# Patient Record
Sex: Female | Born: 2000 | Hispanic: Yes | Marital: Single | Smoking: Never smoker
Health system: Southern US, Community
[De-identification: ages and names within clinical notes are randomized; demographics above are authoritative.]

## PROBLEM LIST (undated history)

## (undated) DIAGNOSIS — Z789 Other specified health status: Secondary | ICD-10-CM

## (undated) HISTORY — PX: OTHER SURGICAL HISTORY: SHX169

## (undated) HISTORY — DX: Other specified health status: Z78.9

## (undated) HISTORY — PX: NO PAST SURGERIES: SHX2092

---
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
## 2021-01-09 ENCOUNTER — Other Ambulatory Visit: Payer: Self-pay

## 2021-01-09 ENCOUNTER — Ambulatory Visit (LOCAL_COMMUNITY_HEALTH_CENTER): Payer: Self-pay

## 2021-01-09 DIAGNOSIS — Z23 Encounter for immunization: Secondary | ICD-10-CM

## 2021-01-09 NOTE — Progress Notes (Signed)
Tolerated flu vaccine well today. Updated [facility] copy given. [employer], interpreter. Jerel Shepherd, RN

## 2021-01-16 ENCOUNTER — Ambulatory Visit (LOCAL_COMMUNITY_HEALTH_CENTER): Payer: Self-pay

## 2021-01-16 ENCOUNTER — Other Ambulatory Visit: Payer: Self-pay

## 2021-01-16 VITALS — BP 119/77 | Wt 172.0 lb

## 2021-01-16 DIAGNOSIS — Z3201 Encounter for pregnancy test, result positive: Secondary | ICD-10-CM

## 2021-01-16 LAB — PREGNANCY, URINE: Preg Test, Ur: POSITIVE — AB

## 2021-01-16 MED ORDER — PRENATAL 27-0.8 MG PO TABS: 1.0000 | ORAL_TABLET | Freq: Every day | ORAL | 0 refills | Status: AC

## 2021-01-16 NOTE — Progress Notes (Signed)
UPT positive today. Plans prenatal care at [facility]. To clerk for preadmit. Jerel Shepherd, RN

## 2021-01-30 ENCOUNTER — Ambulatory Visit: Payer: Medicaid Other | Admitting: Family Medicine

## 2021-01-30 ENCOUNTER — Encounter: Payer: Self-pay | Admitting: Family Medicine

## 2021-01-30 ENCOUNTER — Other Ambulatory Visit: Payer: Self-pay

## 2021-01-30 VITALS — BP 109/68 | HR 83 | Temp 98.9°F | Ht 63.0 in | Wt 171.2 lb

## 2021-01-30 DIAGNOSIS — E669 Obesity, unspecified: Secondary | ICD-10-CM | POA: Insufficient documentation

## 2021-01-30 DIAGNOSIS — O219 Vomiting of pregnancy, unspecified: Secondary | ICD-10-CM

## 2021-01-30 DIAGNOSIS — O99212 Obesity complicating pregnancy, second trimester: Secondary | ICD-10-CM | POA: Diagnosis not present

## 2021-01-30 DIAGNOSIS — Z3402 Encounter for supervision of normal first pregnancy, second trimester: Secondary | ICD-10-CM

## 2021-01-30 DIAGNOSIS — O99210 Obesity complicating pregnancy, unspecified trimester: Secondary | ICD-10-CM | POA: Insufficient documentation

## 2021-01-30 DIAGNOSIS — Z3400 Encounter for supervision of normal first pregnancy, unspecified trimester: Secondary | ICD-10-CM | POA: Insufficient documentation

## 2021-01-30 LAB — URINALYSIS
Bilirubin, UA: NEGATIVE
Glucose, UA: NEGATIVE
Ketones, UA: NEGATIVE
Nitrite, UA: NEGATIVE
Protein,UA: NEGATIVE
RBC, UA: NEGATIVE
Specific Gravity, UA: 1.02 (ref 1.005–1.030)
Urobilinogen, Ur: 0.2 mg/dL (ref 0.2–1.0)
pH, UA: 7 (ref 5.0–7.5)

## 2021-01-30 LAB — HEMOGLOBIN, FINGERSTICK: Hemoglobin: 12.9 g/dL (ref 11.1–15.9)

## 2021-01-30 LAB — WET PREP FOR TRICH, YEAST, CLUE
Trichomonas Exam: NEGATIVE
Yeast Exam: NEGATIVE

## 2021-01-30 MED ORDER — PROMETHAZINE HCL 25 MG PO TABS: 25.0000 mg | ORAL_TABLET | Freq: Four times a day (QID) | ORAL | 1 refills | Status: DC | PRN

## 2021-01-30 NOTE — Progress Notes (Signed)
[facility] HEALTH DEPT [facility] [address] [place] [address] [phone]  INITIAL PRENATAL VISIT NOTE  Subjective:  Selena Chavez is a 20 y.o. G1P0000 at [redacted]w[redacted]d being seen today to start prenatal care at the [facility] Department.  She is currently monitored for the following issues for this low-risk pregnancy and has Supervision of normal first pregnancy, antepartum; Obesity affecting pregnancy, antepartum; and Obesity without serious comorbidity on their problem list.  Patient reports vomiting.  Contractions: Not present. Vag. Bleeding: None.  Movement: Absent. Denies leaking of fluid.   Indications for ASA therapy (per uptodate) One of the following: Previous pregnancy with preeclampsia, especially early onset and with an adverse outcome No Multifetal gestation No Chronic hypertension No Type 1 or 2 diabetes mellitus No Chronic kidney disease No Autoimmune disease (antiphospholipid syndrome, systemic lupus erythematosus) No  Two or more of the following: Nulliparity Yes Obesity (body mass index >30 kg/m2) Yes Family history of preeclampsia in mother or sister No Age ?35 years No Sociodemographic characteristics (African American race, low socioeconomic level) Yes Personal risk factors (eg, previous pregnancy with low birth weight or small for gestational age infant, previous adverse pregnancy outcome [eg, stillbirth], interval >10 years between pregnancies) No   The following portions of the patient's history were reviewed and updated as appropriate: allergies, current medications, past family history, past medical history, past social history, past surgical history and problem list. Problem list updated.  Objective:   Vitals:   01/30/21 0831 01/30/21 0834  BP: 109/68   Pulse: 83   Temp: 98.9 F (37.2 C)   Weight: 171 lb 3.2 oz (77.7 kg)   Height:  5\' 3"  (1.6 m)    Fetal Status: Fetal Heart  Rate (bpm): 149 Fundal Height: 14 cm Movement: Absent  Presentation: Undeterminable   Physical Exam Vitals and nursing note reviewed.  Constitutional:      General: She is not in acute distress.    Appearance: Normal appearance. She is well-developed.  HENT:     Head: Normocephalic and atraumatic.     Right Ear: External ear normal.     Left Ear: External ear normal.     Nose: Nose normal. No congestion or rhinorrhea.     Mouth/Throat:     Lips: Pink.     Mouth: Mucous membranes are moist.     Dentition: Normal dentition. No dental caries.     Pharynx: Oropharynx is clear. Uvula midline.     Comments: Dentition: teeth present, no visible caries Eyes:     General: No scleral icterus.    Conjunctiva/sclera: Conjunctivae normal.  Neck:     Thyroid: No thyroid mass or thyromegaly.  Cardiovascular:     Rate and Rhythm: Normal rate.     Pulses: Normal pulses.     Comments: Extremities are warm and well perfused Pulmonary:     Effort: Pulmonary effort is normal.     Breath sounds: Normal breath sounds.  Chest:     Chest wall: No mass.  Breasts:     Tanner Score is 5. Breasts are symmetrical.     Right: Normal. No mass, nipple discharge, skin change or axillary adenopathy.     Left: Normal. No mass, nipple discharge, skin change or axillary adenopathy.    Abdominal:     General: Abdomen is flat.     Palpations: Abdomen is soft.     Tenderness: There is no abdominal tenderness.  Comments: Gravid   Genitourinary:    General: Normal vulva.     Exam position: Lithotomy position.     Pubic Area: No rash.      Labia:        Right: No rash.        Left: No rash.      Vagina: Normal. No vaginal discharge.     Cervix: No cervical motion tenderness or friability.     Uterus: Normal. Enlarged (Gravid 14 size). Not tender.      Adnexa: Right adnexa normal and left adnexa normal.     Rectum: Normal. No external hemorrhoid.  Musculoskeletal:     Right lower leg: No edema.      Left lower leg: No edema.  Lymphadenopathy:     Cervical: No cervical adenopathy.     Upper Body:     Right upper body: No axillary adenopathy.     Left upper body: No axillary adenopathy.  Skin:    General: Skin is warm.     Capillary Refill: Capillary refill takes less than 2 seconds.  Neurological:     Mental Status: She is alert.     Assessment and Plan:  Pregnancy: G1P0000 at [redacted]w[redacted]d  1. Supervision of normal first pregnancy, antepartum  Patient in clinic today for initial prenatal visit.  Patient is happy about pregnancy but was not planning pregnancy.   Patient lives in [place] with FOB, pt is not currently working.  FOB is happy and supportive about pregnancy.    Patient reports having vomiting and nausea most days for last 1.5 week.  Discussed medication management and information given to prevent nausea.   Patient denies any ER visit since being pregnant.  Patient is sure about LMP of 10/21/20.  Denies contraction or bleeding.  Denies any chronic medical issues, HSV , hx of any abuse, allergies. No history of PAP d/t age.  No hx of ETOH, tobacco or drug use.   No hx of previous pregnancies.   Covid and flu vaccine completed on 01/09/21 Discussed starting   Last dentist: 08/2020   Will have Quad screen and anatomy U/S at 18 weeks.   Patient delivery group is [facility]    Hgb A1c w/o eAG - HIV Antibody (routine testing w rflx) - Comprehensive metabolic panel - Prenatal profile without Varicella/Rubella (202542) - Protein / creatinine ratio, urine  (Spot) - Urine Culture - TSH - Chlamydia/GC NAA, Confirmation - HCV Ab w/Rflx to Verification - 706237 Drug Screen - WET PREP FOR TRICH, YEAST, CLUE - Urinalysis (Urine Dip) - Hemoglobin, venipuncture  2. Vomiting affecting pregnancy, antepartum Patient reports vomiting most days .  Last vomting was 1 day ago.  Will have patient RTC for 1 hr gtt. D/t vomiting.  Patient given directions to cut in 1/2 most days when needed, d/t  can make drowsy, but  take 1 full tablet on day of appointment for 1 hr gtt.  Also to have driver d/t risk of drowsy .  Patient verbalizes understanding.    - promethazine (PHENERGAN) 25 MG tablet; Take 1 tablet (25 mg total) by mouth every 6 (six) hours as needed for nausea or vomiting.  Dispense: 15 tablet; Refill: 0  3. Obesity affecting pregnancy, antepartum 4. Obesity without serious comorbidity, unspecified classification, unspecified obesity type   MNT referral completed today.  Patient to start ASA therapy.  Information sheet given.    Discussed overview of care and coordination with inpatient delivery practices including [facility], [facility], [facility] and  [facility].    Preterm labor symptoms and general obstetric precautions including but not limited to vaginal bleeding, contractions, leaking of fluid and fetal movement were reviewed in detail with the patient.  Please refer to After Visit Summary for other counseling recommendations.   Return in about 1 week (around 02/06/2021) for 1 hr gtt visit, 4 week routine prenatal follow up .  Future Appointments  Date Time Provider Department Center  02/06/2021  8:50 AM [facility] NURSE [facility] None  02/27/2021  8:20 AM [facility] PROVIDER AC-[facility] None   V. Olmedo used for [place] interpretation.     Wendi Snipes, FNP

## 2021-01-30 NOTE — Progress Notes (Signed)
Here today for 14.3 week MH IP appt. Taking PNV QD. Denies ED/hospital visits since +PT. Needs 1hgtt today but is complaining of frequent N/V. States last episode of vomiting was yesterday and reports vomiting up to three times daily. Complains "cannot keep even water down." States has only drank water this morning but has kept water down today. Tawny Hopping, RN

## 2021-01-30 NOTE — Progress Notes (Signed)
Wet [place], Hgb and UA results reviewed. Per standing orders no treatment indicated. Tawny Hopping, RN

## 2021-01-31 LAB — HCV AB W REFLEX TO QUANT PCR: HCV Ab: <0.1 s/co ratio (ref 0.0–0.9)

## 2021-01-31 LAB — COMPREHENSIVE METABOLIC PANEL
ALT: 17 IU/L (ref 0–32)
AST: 25 IU/L (ref 0–40)
Albumin/Globulin Ratio: 1.6 (ref 1.2–2.2)
Albumin: 4.3 g/dL (ref 3.9–5.0)
Alkaline Phosphatase: 59 IU/L (ref 42–106)
BUN/Creatinine Ratio: 9 (ref 9–23)
BUN: 4 mg/dL — ABNORMAL LOW (ref 6–20)
Bilirubin Total: <0.2 mg/dL (ref 0.0–1.2)
CO2: 19 mmol/L — ABNORMAL LOW (ref 20–29)
Calcium: 9.4 mg/dL (ref 8.7–10.2)
Chloride: 101 mmol/L (ref 96–106)
Creatinine, Ser: 0.45 mg/dL — ABNORMAL LOW (ref 0.57–1.00)
Globulin, Total: 2.7 g/dL (ref 1.5–4.5)
Glucose: 78 mg/dL (ref 65–99)
Potassium: 3.9 mmol/L (ref 3.5–5.2)
Sodium: 137 mmol/L (ref 134–144)
Total Protein: 7 g/dL (ref 6.0–8.5)
eGFR: 143 mL/min/{1.73_m2} (ref >59)

## 2021-01-31 LAB — CBC/D/PLT+RPR+RH+ABO+AB SCR
Antibody Screen: NEGATIVE
Basophils Absolute: 0 10*3/uL (ref 0.0–0.2)
Basos: 0 %
EOS (ABSOLUTE): 0.1 10*3/uL (ref 0.0–0.4)
Eos: 1 %
Hematocrit: 38.5 % (ref 34.0–46.6)
Hemoglobin: 12.8 g/dL (ref 11.1–15.9)
Hepatitis B Surface Ag: NEGATIVE
Immature Grans (Abs): 0 10*3/uL (ref 0.0–0.1)
Immature Granulocytes: 0 %
Lymphocytes Absolute: 1.4 10*3/uL (ref 0.7–3.1)
Lymphs: 19 %
MCH: 29.5 pg (ref 26.6–33.0)
MCHC: 33.2 g/dL (ref 31.5–35.7)
MCV: 89 fL (ref 79–97)
Monocytes Absolute: 0.4 10*3/uL (ref 0.1–0.9)
Monocytes: 5 %
Neutrophils Absolute: 5.5 10*3/uL (ref 1.4–7.0)
Neutrophils: 75 %
Platelets: 280 10*3/uL (ref 150–450)
RBC: 4.34 x10E6/uL (ref 3.77–5.28)
RDW: 13.7 % (ref 11.7–15.4)
RPR Ser Ql: NONREACTIVE
Rh Factor: POSITIVE
WBC: 7.5 10*3/uL (ref 3.4–10.8)

## 2021-01-31 LAB — 789231 7+OXYCODONE-BUND
Amphetamines, Urine: NEGATIVE ng/mL
BENZODIAZ UR QL: NEGATIVE ng/mL
Barbiturate screen, urine: NEGATIVE ng/mL
Cannabinoid Quant, Ur: NEGATIVE ng/mL
Cocaine (Metab.): NEGATIVE ng/mL
OPIATE SCREEN URINE: NEGATIVE ng/mL
Oxycodone/Oxymorphone, Urine: NEGATIVE ng/mL
PCP Quant, Ur: NEGATIVE ng/mL

## 2021-01-31 LAB — HCV INTERPRETATION

## 2021-01-31 LAB — PROTEIN / CREATININE RATIO, URINE
Creatinine, Urine: 109.7 mg/dL
Protein, Ur: 8.5 mg/dL
Protein/Creat Ratio: 77 mg/g creat (ref 0–200)

## 2021-01-31 LAB — HIV-1/HIV-2 QUALITATIVE RNA
HIV-1 RNA, Qualitative: NONREACTIVE
HIV-2 RNA, Qualitative: NONREACTIVE

## 2021-01-31 LAB — HGB A1C W/O EAG: Hgb A1c MFr Bld: 5.2 % (ref 4.8–5.6)

## 2021-01-31 LAB — TSH: TSH: 1.48 u[IU]/mL (ref 0.450–4.500)

## 2021-02-01 ENCOUNTER — Telehealth: Payer: Self-pay

## 2021-02-01 NOTE — Addendum Note (Signed)
Addended by: Wendi Snipes on: 02/01/2021 09:27 AM   Modules accepted: Orders

## 2021-02-01 NOTE — Telephone Encounter (Signed)
Call to [facility] scheduler for anatomy US appt electronically ordered by Elveria Rising FNP-BC. As 14 5/7, scheduler aware need appt in 4 - 5 weeks. Appt scheduled for 03/13/2021 with arrival time of 1230 with full bladder (1st available anatomy US appt). Call to client with [facility] [employer] ID # [phone] and per recorded message, not accepting calls at this time. Call to emergency contact (father) and per recorded message, voicemail box is full. Jossie Ng, RN

## 2021-02-02 LAB — URINE CULTURE

## 2021-02-02 LAB — CHLAMYDIA/GC NAA, CONFIRMATION
Chlamydia trachomatis, NAA: NEGATIVE
Neisseria gonorrhoeae, NAA: NEGATIVE

## 2021-02-02 NOTE — Telephone Encounter (Signed)
Phone call to pt with language line interpreter. [phone] Not accepting calls, unable to leave message.  Phone call  to [phone]. Mailbox full, unable to leave message.

## 2021-02-02 NOTE — Progress Notes (Signed)
Chart reviewed by Pharmacist  Suzanne Walker PharmD, Contract Pharmacist at [facility]  

## 2021-02-05 NOTE — Telephone Encounter (Signed)
Phone call to pt with language line interpreter [phone].  Call to [phone].  Voicemail not set up. Unable to leave message. Call to [phone].  Not available, mailbox full. Unable to leave message.

## 2021-02-06 ENCOUNTER — Other Ambulatory Visit: Payer: Self-pay

## 2021-02-06 ENCOUNTER — Other Ambulatory Visit: Payer: Medicaid Other

## 2021-02-06 DIAGNOSIS — Z3400 Encounter for supervision of normal first pregnancy, unspecified trimester: Secondary | ICD-10-CM

## 2021-02-06 NOTE — Progress Notes (Signed)
In Nurse Clinic today for 1 hr GTT. Juliene Pina, interpreter. Pt drank glucose drink within 5 min finishing at 9:10am. Instructions given to have lab drawn at 10:10am, remain npo while here, notify RN if n/v, stay on first floor. May leave after blood drawn. Lab personnel notified of time lab to be drawn. Questions answered and reports understanding. Jerel Shepherd, RN

## 2021-02-06 NOTE — Telephone Encounter (Signed)
Call to client to notify her of [place] appt. No answere and per recorded message, no voicemail set up (pink sticky note created to ask client to set up). Call to emergency contact and no answer and per recorded message, voicemail box is full. [employer] ID # [id number] utilized during call. Jossie Ng, RN

## 2021-02-07 ENCOUNTER — Telehealth: Payer: Self-pay | Admitting: Dietician

## 2021-02-07 LAB — GLUCOSE, 1 HOUR GESTATIONAL: Gestational Diabetes Screen: 73 mg/dL (ref 65–139)

## 2021-02-07 NOTE — Telephone Encounter (Signed)
Call to client with Marlene Yemen (interpreter) to give [place] appt and per recorded message, her voicemail is not set up and emergency contact (father) voicemail box is full. Jossie Ng, RN

## 2021-02-08 NOTE — Telephone Encounter (Signed)
Call to client with [facility] ID# [phone] and notified of [place] appt. Verbal directions to facility provided with stated understandibng. Reminded of ext [facility] RV appt. Jossie Ng, RN

## 2021-02-27 ENCOUNTER — Other Ambulatory Visit: Payer: Self-pay

## 2021-02-27 ENCOUNTER — Ambulatory Visit: Payer: Self-pay

## 2021-02-27 ENCOUNTER — Ambulatory Visit: Payer: Medicaid Other | Admitting: Family Medicine

## 2021-02-27 VITALS — BP 108/69 | HR 97 | Temp 98.9°F | Wt 168.4 lb

## 2021-02-27 DIAGNOSIS — Z3402 Encounter for supervision of normal first pregnancy, second trimester: Secondary | ICD-10-CM

## 2021-02-27 DIAGNOSIS — O99212 Obesity complicating pregnancy, second trimester: Secondary | ICD-10-CM | POA: Diagnosis not present

## 2021-02-27 DIAGNOSIS — Z3400 Encounter for supervision of normal first pregnancy, unspecified trimester: Secondary | ICD-10-CM

## 2021-02-27 DIAGNOSIS — O99210 Obesity complicating pregnancy, unspecified trimester: Secondary | ICD-10-CM

## 2021-02-27 NOTE — Progress Notes (Signed)
Per client, has set up voicemail on telephone. Aware of [facility] [place] appt at 03/13/2021 with arrival time of 1230. Desires Quad screen today. Jossie Ng, RN

## 2021-02-27 NOTE — Progress Notes (Signed)
   PRENATAL VISIT NOTE  Subjective:  Selena Chavez is a 20 y.o. G1P0000 at [redacted]w[redacted]d being seen today for ongoing prenatal care.  She is currently monitored for the following issues for this low-risk pregnancy and has Supervision of normal first pregnancy, antepartum; Obesity affecting pregnancy, antepartum; and Obesity without serious comorbidity on their problem list.  Patient reports nausea.  Contractions: Not present. Vag. Bleeding: None.  Movement: Absent. Denies leaking of fluid/ROM.   The following portions of the patient's history were reviewed and updated as appropriate: allergies, current medications, past family history, past medical history, past social history, past surgical history and problem list. Problem list updated.  Objective:   Vitals:   02/27/21 0828  BP: 108/69  Pulse: 97  Temp: 98.9 F (37.2 C)  Weight: 168 lb 6.4 oz (76.4 kg)    Fetal Status: Fetal Heart Rate (bpm): 140 Fundal Height: 18 cm Movement: Absent     General:  Alert, oriented and cooperative. Patient is in no acute distress.  Skin: Skin is warm and dry. No rash noted.   Cardiovascular: Normal heart rate noted  Respiratory: Normal respiratory effort, no problems with respiration noted  Abdomen: Soft, gravid, appropriate for gestational age.  Pain/Pressure: Absent     Pelvic: Cervical exam deferred        Extremities: Normal range of motion.  Edema: None  Mental Status: Normal mood and affect. Normal behavior. Normal judgment and thought content.   Assessment and Plan:  Pregnancy: G1P0000 at [redacted]w[redacted]d  1. Supervision of normal first pregnancy, antepartum -Anatomy US scheduled for 03/13/21 -Quad done today  -taking aspirin as directed  - QUAD Screen [facility] Only  2. Obesity affecting pregnancy, antepartum Discussed with patient the RD attempted to call to discuss nutrition but no answer and No VM set up-.   Encouraged to Stop by [facility] to reschedule with RD.    Preterm labor symptoms and general  obstetric precautions including but not limited to vaginal bleeding, contractions, leaking of fluid and fetal movement were reviewed in detail with the patient. Please refer to After Visit Summary for other counseling recommendations.  Return in about 4 weeks (around 03/27/2021) for routine prenatal care.  Future Appointments  Date Time Provider Department Center  03/13/2021  1:00 PM [facility]   M. [place]way used for Spanish interpretation.     Wendi Snipes, FNP

## 2021-03-13 ENCOUNTER — Other Ambulatory Visit: Payer: Self-pay

## 2021-03-13 ENCOUNTER — Ambulatory Visit
Admission: RE | Admit: 2021-03-13 | Discharge: 2021-03-13 | Disposition: A | Discharge status: Home / Self Care | Payer: Self-pay | Source: Ambulatory Visit | Attending: Family Medicine | Admitting: Family Medicine

## 2021-03-13 DIAGNOSIS — Z3400 Encounter for supervision of normal first pregnancy, unspecified trimester: Secondary | ICD-10-CM | POA: Insufficient documentation

## 2021-03-13 DIAGNOSIS — O99210 Obesity complicating pregnancy, unspecified trimester: Secondary | ICD-10-CM | POA: Insufficient documentation

## 2021-03-27 ENCOUNTER — Other Ambulatory Visit: Payer: Self-pay

## 2021-03-27 ENCOUNTER — Encounter: Payer: Self-pay | Admitting: Family Medicine

## 2021-03-27 ENCOUNTER — Ambulatory Visit: Payer: Self-pay | Admitting: Family Medicine

## 2021-03-27 VITALS — BP 112/63 | HR 76 | Temp 98.3°F | Wt 172.4 lb

## 2021-03-27 DIAGNOSIS — Z3402 Encounter for supervision of normal first pregnancy, second trimester: Secondary | ICD-10-CM

## 2021-03-27 DIAGNOSIS — Z3400 Encounter for supervision of normal first pregnancy, unspecified trimester: Secondary | ICD-10-CM

## 2021-03-27 DIAGNOSIS — O99212 Obesity complicating pregnancy, second trimester: Secondary | ICD-10-CM

## 2021-03-27 DIAGNOSIS — O99210 Obesity complicating pregnancy, unspecified trimester: Secondary | ICD-10-CM

## 2021-03-27 NOTE — Progress Notes (Signed)
   PRENATAL VISIT NOTE  Subjective:  Selena Chavez is a 20 y.o. G1P0000 at [redacted]w[redacted]d being seen today for ongoing prenatal care.  She is currently monitored for the following issues for this low-risk pregnancy and has Supervision of normal first pregnancy, antepartum; Obesity affecting pregnancy, antepartum; and Obesity without serious comorbidity on their problem list.  Patient reports no complaints.  Contractions: Not present. Vag. Bleeding: None.  Movement: Present. Denies leaking of fluid/ROM.   The following portions of the patient's history were reviewed and updated as appropriate: allergies, current medications, past family history, past medical history, past social history, past surgical history and problem list. Problem list updated.  Objective:   Vitals:   03/27/21 1309  BP: 112/63  Pulse: 76  Temp: 98.3 F (36.8 C)  Weight: 172 lb 6.4 oz (78.2 kg)    Fetal Status: Fetal Heart Rate (bpm): 141 Fundal Height: 23 cm Movement: Present     General:  Alert, oriented and cooperative. Patient is in no acute distress.  Skin: Skin is warm and dry. No rash noted.   Cardiovascular: Normal heart rate noted  Respiratory: Normal respiratory effort, no problems with respiration noted  Abdomen: Soft, gravid, appropriate for gestational age.  Pain/Pressure: Absent     Pelvic: Cervical exam deferred        Extremities: Normal range of motion.  Edema: None  Mental Status: Normal mood and affect. Normal behavior. Normal judgment and thought content.   Assessment and Plan:  Pregnancy: G1P0000 at [redacted]w[redacted]d  1. Supervision of normal first pregnancy, antepartum Up to date  Confirmed plan to infant feed with BM and formula Patient is engaged with [facility] Reviewed Anatomy US results which were normal   2. Obesity affecting pregnancy, antepartum TWG=-3 lb 9.6 oz (-1.633 kg) Gain appropriate so far   Preterm labor symptoms and general obstetric precautions including but not limited to vaginal  bleeding, contractions, leaking of fluid and fetal movement were reviewed in detail with the patient. Please refer to After Visit Summary for other counseling recommendations.  Return in about 4 weeks (around 04/24/2021) for Routine prenatal care.  No future appointments.  Federico Flake, MD

## 2021-03-28 NOTE — Addendum Note (Signed)
Addended by: Heywood Bene on: 03/28/2021 03:39 PM   Modules accepted: Orders

## 2021-03-29 ENCOUNTER — Encounter: Payer: Self-pay | Admitting: Family Medicine

## 2021-03-29 NOTE — Progress Notes (Signed)
Updated U/S results   Wendi Snipes, FNP

## 2021-04-24 ENCOUNTER — Ambulatory Visit: Payer: Self-pay | Admitting: Family Medicine

## 2021-04-24 ENCOUNTER — Other Ambulatory Visit: Payer: Self-pay

## 2021-04-24 VITALS — BP 111/58 | HR 81 | Temp 98.2°F | Wt 183.0 lb

## 2021-04-24 DIAGNOSIS — O99212 Obesity complicating pregnancy, second trimester: Secondary | ICD-10-CM

## 2021-04-24 DIAGNOSIS — Z3400 Encounter for supervision of normal first pregnancy, unspecified trimester: Secondary | ICD-10-CM

## 2021-04-24 DIAGNOSIS — O99210 Obesity complicating pregnancy, unspecified trimester: Secondary | ICD-10-CM

## 2021-04-24 DIAGNOSIS — Z3402 Encounter for supervision of normal first pregnancy, second trimester: Secondary | ICD-10-CM

## 2021-04-25 NOTE — Progress Notes (Signed)
   PRENATAL VISIT NOTE  Subjective:  Selena Chavez is a 20 y.o. G1P0000 at [redacted]w[redacted]d being seen today for ongoing prenatal care.  She is currently monitored for the following issues for this low-risk pregnancy and has Supervision of normal first pregnancy, antepartum; Obesity affecting pregnancy, antepartum; and Obesity without serious comorbidity on their problem list.  Patient reports no complaints.  Contractions: Not present. Vag. Bleeding: None.  Movement: Present. Denies leaking of fluid/ROM.   The following portions of the patient's history were reviewed and updated as appropriate: allergies, current medications, past family history, past medical history, past social history, past surgical history and problem list. Problem list updated.  Objective:   Vitals:   04/24/21 1349  BP: (!) 111/58  Pulse: 81  Temp: 98.2 F (36.8 C)  Weight: 183 lb (83 kg)    Fetal Status: Fetal Heart Rate (bpm): 132 Fundal Height: 27 cm Movement: Present     General:  Alert, oriented and cooperative. Patient is in no acute distress.  Skin: Skin is warm and dry. No rash noted.   Cardiovascular: Normal heart rate noted  Respiratory: Normal respiratory effort, no problems with respiration noted  Abdomen: Soft, gravid, appropriate for gestational age.  Pain/Pressure: Absent     Pelvic: Cervical exam deferred        Extremities: Normal range of motion.  Edema: None  Mental Status: Normal mood and affect. Normal behavior. Normal judgment and thought content.   Assessment and Plan:  Pregnancy: G1P0000 at [redacted]w[redacted]d  1. Supervision of normal first pregnancy, antepartum Anticipated guidance- 28 weeks labs and change to every 2 week visits.   PTL information given  No N/V   2. Obesity affecting pregnancy, antepartum  TWG= 7 lbs Discussed diet and exercise.    Preterm labor symptoms and general obstetric precautions including but not limited to vaginal bleeding, contractions, leaking of fluid and fetal  movement were reviewed in detail with the patient. Please refer to After Visit Summary for other counseling recommendations.  Return in about 2 weeks (around 05/08/2021) for routine prenatal care, 28 week labs.  Future Appointments  Date Time Provider Department Center  05/08/2021  1:20 PM [facility] PROVIDER AC-[facility] None   V. Olmedo used for [place] interpretation.     Wendi Snipes, FNP

## 2021-05-08 ENCOUNTER — Ambulatory Visit: Payer: Self-pay | Admitting: Advanced Practice Midwife

## 2021-05-08 ENCOUNTER — Other Ambulatory Visit: Payer: Self-pay

## 2021-05-08 VITALS — BP 114/68 | HR 90 | Temp 99.0°F | Wt 187.2 lb

## 2021-05-08 DIAGNOSIS — O99210 Obesity complicating pregnancy, unspecified trimester: Secondary | ICD-10-CM

## 2021-05-08 DIAGNOSIS — O99213 Obesity complicating pregnancy, third trimester: Secondary | ICD-10-CM

## 2021-05-08 DIAGNOSIS — Z3403 Encounter for supervision of normal first pregnancy, third trimester: Secondary | ICD-10-CM

## 2021-05-08 DIAGNOSIS — Z3400 Encounter for supervision of normal first pregnancy, unspecified trimester: Secondary | ICD-10-CM

## 2021-05-08 DIAGNOSIS — Z23 Encounter for immunization: Secondary | ICD-10-CM

## 2021-05-08 MED ORDER — PRENATAL VITAMIN 27-0.8 MG PO TABS: 1.0000 | ORAL_TABLET | Freq: Every day | ORAL | 0 refills | Status: AC

## 2021-05-08 NOTE — Progress Notes (Signed)
Hgb reviewed, no treatment indicated. Patient given Tdap and tolerated well. Patient felt faint after her blood draw and stayed in room lying down with cool towel. Now feeling much better and well enough to leave. Patient states she has a ride home. Declined cold water to drink.Burt Knack, RN

## 2021-05-08 NOTE — Progress Notes (Signed)
   PRENATAL VISIT NOTE  Subjective:  Selena Chavez is a 20 y.o. G1P0000 at [redacted]w[redacted]d being seen today for ongoing prenatal care.  She is currently monitored for the following issues for this low-risk pregnancy and has Supervision of normal first pregnancy, antepartum and Obesity affecting pregnancy, antepartum BMI=31.1 on their problem list.  Patient reports no complaints.  Contractions: Not present. Vag. Bleeding: None.  Movement: Present. Denies leaking of fluid/ROM.   The following portions of the patient's history were reviewed and updated as appropriate: allergies, current medications, past family history, past medical history, past social history, past surgical history and problem list. Problem list updated.  Objective:   Vitals:   05/08/21 1320  BP: 114/68  Pulse: 90  Temp: 99 F (37.2 C)  Weight: 187 lb 3.2 oz (84.9 kg)    Fetal Status: Fetal Heart Rate (bpm): 130 Fundal Height: 28 cm Movement: Present     General:  Alert, oriented and cooperative. Patient is in no acute distress.  Skin: Skin is warm and dry. No rash noted.   Cardiovascular: Normal heart rate noted  Respiratory: Normal respiratory effort, no problems with respiration noted  Abdomen: Soft, gravid, appropriate for gestational age.  Pain/Pressure: Absent     Pelvic: Cervical exam deferred        Extremities: Normal range of motion.  Edema: None  Mental Status: Normal mood and affect. Normal behavior. Normal judgment and thought content.   Assessment and Plan:  Pregnancy: G1P0000 at [redacted]w[redacted]d  1. Obesity affecting pregnancy, antepartum BMI=31.1 11 lb 3.2 oz (5.08 kg) Taking ASA 81 mg daily Walking 3x/wk x 30 min  2. Supervision of normal first pregnancy, antepartum Not working and living with FOB Reviewed 03/13/21 u/s with no previa, anterior placenta at 20 5/7, AFI wnl, anatomy wnl 1 hour glucola today - HIV-1/HIV-2 Qualitative RNA - RPR - Glucose tolerance, 1 hour - Prenatal Vit-Fe Fumarate-FA  (PRENATAL VITAMIN) 27-0.8 MG TABS; Take 1 tablet by mouth daily.  Dispense: 100 tablet; Refill: 0 - Hemoglobin, venipuncture   Preterm labor symptoms and general obstetric precautions including but not limited to vaginal bleeding, contractions, leaking of fluid and fetal movement were reviewed in detail with the patient. Please refer to After Visit Summary for other counseling recommendations.  No follow-ups on file.  No future appointments.  Alberteen Spindle, CNM

## 2021-05-08 NOTE — Progress Notes (Signed)
Patient here for MH RV at 28 3/7. 28 week labs today, 1 hour gtt and Tdap.Burt Knack, RN

## 2021-05-09 LAB — HEMOGLOBIN, FINGERSTICK: Hemoglobin: 12.3 g/dL (ref 11.1–15.9)

## 2021-05-10 LAB — HIV-1/HIV-2 QUALITATIVE RNA
HIV-1 RNA, Qualitative: NONREACTIVE
HIV-2 RNA, Qualitative: NONREACTIVE

## 2021-05-10 LAB — GLUCOSE, 1 HOUR GESTATIONAL: Gestational Diabetes Screen: 85 mg/dL (ref 65–139)

## 2021-05-10 LAB — RPR: RPR Ser Ql: NONREACTIVE

## 2021-05-22 ENCOUNTER — Ambulatory Visit: Payer: Self-pay | Admitting: Advanced Practice Midwife

## 2021-05-22 ENCOUNTER — Other Ambulatory Visit: Payer: Self-pay

## 2021-05-22 VITALS — BP 109/68 | HR 96 | Temp 98.3°F | Wt 192.0 lb

## 2021-05-22 DIAGNOSIS — Z3403 Encounter for supervision of normal first pregnancy, third trimester: Secondary | ICD-10-CM

## 2021-05-22 DIAGNOSIS — Z3400 Encounter for supervision of normal first pregnancy, unspecified trimester: Secondary | ICD-10-CM

## 2021-05-22 DIAGNOSIS — O99213 Obesity complicating pregnancy, third trimester: Secondary | ICD-10-CM

## 2021-05-22 DIAGNOSIS — O99210 Obesity complicating pregnancy, unspecified trimester: Secondary | ICD-10-CM

## 2021-05-22 NOTE — Addendum Note (Signed)
Addended by: Heywood Bene on: 05/22/2021 09:36 AM   Modules accepted: Orders

## 2021-05-22 NOTE — Progress Notes (Signed)
Here today for 30.3 week MH RV. Taking PNV QD. Denies ED/hospital visits since last RV. Kick count instructions and cards given. Tawny Hopping, RN

## 2021-05-22 NOTE — Progress Notes (Signed)
   PRENATAL VISIT NOTE  Subjective:  Selena Chavez is a 20 y.o. G1P0000 at [redacted]w[redacted]d being seen today for ongoing prenatal care.  She is currently monitored for the following issues for this low-risk pregnancy and has Supervision of normal first pregnancy, antepartum and Obesity affecting pregnancy, antepartum BMI=31.1 on their problem list.  Patient reports  low abdominal cramps intermittently since last week relieved with rest and fluids .  Contractions: Not present. Vag. Bleeding: None.  Movement: Present.  Denies leaking of fluid/ROM.   The following portions of the patient's history were reviewed and updated as appropriate: allergies, current medications, past family history, past medical history, past social history, past surgical history and problem list. Problem list updated.  Objective:   Vitals:   05/22/21 0837  BP: 109/68  Pulse: 96  Temp: 98.3 F (36.8 C)  Weight: 192 lb (87.1 kg)    Fetal Status: Fetal Heart Rate (bpm): 120 Fundal Height: 31 cm Movement: Present     General:  Alert, oriented and cooperative. Patient is in no acute distress.  Skin: Skin is warm and dry. No rash noted.   Cardiovascular: Normal heart rate noted  Respiratory: Normal respiratory effort, no problems with respiration noted  Abdomen: Soft, gravid, appropriate for gestational age.  Pain/Pressure: Absent     Pelvic: Cervical exam deferred        Extremities: Normal range of motion.  Edema: None  Mental Status: Normal mood and affect. Normal behavior. Normal judgment and thought content.   Assessment and Plan:  Pregnancy: G1P0000 at [redacted]w[redacted]d  1. Obesity affecting pregnancy, antepartum BMI=31.1 16 lb (7.258 kg) Gained 5 lbs in last 2 wks Walking 3x/wk x 30 min Taking ASA 81 mg daily  2. Supervision of normal first pregnancy, antepartum 1 hour glucola from 05/08/21=85 C&S done for low abdominal cramps since last week intermittently; denies dysuria - Urine Culture & Sensitivity   Preterm  labor symptoms and general obstetric precautions including but not limited to vaginal bleeding, contractions, leaking of fluid and fetal movement were reviewed in detail with the patient. Please refer to After Visit Summary for other counseling recommendations.  No follow-ups on file.  No future appointments.  Alberteen Spindle, CNM

## 2021-05-25 LAB — URINE CULTURE

## 2021-06-05 ENCOUNTER — Other Ambulatory Visit: Payer: Self-pay

## 2021-06-05 ENCOUNTER — Ambulatory Visit: Payer: Self-pay | Admitting: Family Medicine

## 2021-06-05 VITALS — BP 98/68 | HR 124 | Temp 98.7°F | Wt 197.4 lb

## 2021-06-05 DIAGNOSIS — O99210 Obesity complicating pregnancy, unspecified trimester: Secondary | ICD-10-CM

## 2021-06-05 DIAGNOSIS — O99213 Obesity complicating pregnancy, third trimester: Secondary | ICD-10-CM

## 2021-06-05 DIAGNOSIS — Z3480 Encounter for supervision of other normal pregnancy, unspecified trimester: Secondary | ICD-10-CM

## 2021-06-05 DIAGNOSIS — Z3400 Encounter for supervision of normal first pregnancy, unspecified trimester: Secondary | ICD-10-CM

## 2021-06-05 NOTE — Progress Notes (Signed)
   PRENATAL VISIT NOTE  Subjective:  Secret Kristensen is a 20 y.o. G1P0000 at [redacted]w[redacted]d being seen today for ongoing prenatal care.  She is currently monitored for the following issues for this low-risk pregnancy and has Supervision of normal first pregnancy, antepartum and Obesity affecting pregnancy, antepartum BMI=31.1 on their problem list.  Patient reports no complaints.  Contractions: Not present. Vag. Bleeding: None.  Movement: Present. Denies leaking of fluid/ROM.   The following portions of the patient's history were reviewed and updated as appropriate: allergies, current medications, past family history, past medical history, past social history, past surgical history and problem list. Problem list updated.  Objective:   Vitals:   06/05/21 0842 06/05/21 0850  BP: 121/76 98/68  Pulse: (!) 111 (!) 124  Temp: 98.7 F (37.1 C)   Weight: 197 lb 6.4 oz (89.5 kg)     Fetal Status: Fetal Heart Rate (bpm): 130 Fundal Height: 32 cm Movement: Present     General:  Alert, oriented and cooperative. Patient is in no acute distress.  Skin: Skin is warm and dry. No rash noted.   Cardiovascular: Normal heart rate noted  Respiratory: Normal respiratory effort, no problems with respiration noted  Abdomen: Soft, gravid, appropriate for gestational age.  Pain/Pressure: Absent     Pelvic: Cervical exam deferred        Extremities: Normal range of motion.  Edema: None  Mental Status: Normal mood and affect. Normal behavior. Normal judgment and thought content.   Assessment and Plan:  Pregnancy: G1P0000 at [redacted]w[redacted]d  1. Supervision of normal first pregnancy, antepartum -taking PNV and ASA as directed  -just started driving and reports having some anxious at times.  -pt wants nexplanon as PP contraception  -feeding plan is breast -kick counts were reviewed at last visit , no questions  2. Obesity affecting pregnancy, antepartum BMI=31.1 -walking 3-4 x per week ~ 30 mins     Preterm labor  symptoms and general obstetric precautions including but not limited to vaginal bleeding, contractions, leaking of fluid and fetal movement were reviewed in detail with the patient. Please refer to After Visit Summary for other counseling recommendations.  No follow-ups on file.  Future Appointments  Date Time Provider Department Center  06/19/2021  8:40 AM [facility] PROVIDER [facility] None   V. Olmedo used for [place] interpretation.     Wendi Snipes, FNP

## 2021-06-19 ENCOUNTER — Ambulatory Visit: Payer: Self-pay | Admitting: Family Medicine

## 2021-06-19 ENCOUNTER — Other Ambulatory Visit: Payer: Self-pay

## 2021-06-19 VITALS — BP 103/65 | HR 120 | Temp 98.2°F | Wt 201.2 lb

## 2021-06-19 DIAGNOSIS — Z3400 Encounter for supervision of normal first pregnancy, unspecified trimester: Secondary | ICD-10-CM

## 2021-06-19 DIAGNOSIS — Z3403 Encounter for supervision of normal first pregnancy, third trimester: Secondary | ICD-10-CM

## 2021-06-19 LAB — URINALYSIS
Bilirubin, UA: NEGATIVE
Glucose, UA: NEGATIVE
Ketones, UA: NEGATIVE
Nitrite, UA: NEGATIVE
Protein,UA: NEGATIVE
RBC, UA: NEGATIVE
Specific Gravity, UA: 1.01 (ref 1.005–1.030)
Urobilinogen, Ur: 0.2 mg/dL (ref 0.2–1.0)
pH, UA: 7 (ref 5.0–7.5)

## 2021-06-19 NOTE — Progress Notes (Signed)
   PRENATAL VISIT NOTE  Subjective:  Selena Chavez is a 20 y.o. G1P0000 at [redacted]w[redacted]d being seen today for ongoing prenatal care.  She is currently monitored for the following issues for this low-risk pregnancy and has Supervision of normal first pregnancy, antepartum and Obesity affecting pregnancy, antepartum BMI=31.1 on their problem list.  Patient reports Edema in feet that some times, will go down with elevation and sometimes takes a while.  Contractions: Irritability. Vag. Bleeding: None.  Movement: Present. Denies leaking of fluid/ROM.   The following portions of the patient's history were reviewed and updated as appropriate: allergies, current medications, past family history, past medical history, past social history, past surgical history and problem list. Problem list updated.  Objective:   Vitals:   06/19/21 0827  BP: 103/65  Pulse: (!) 120  Temp: 98.2 F (36.8 C)  Weight: 201 lb 3.2 oz (91.3 kg)    Fetal Status: Fetal Heart Rate (bpm): 139 Fundal Height: 33 cm Movement: Present     General:  Alert, oriented and cooperative. Patient is in no acute distress.  Skin: Skin is warm and dry. No rash noted.   Cardiovascular: Normal heart rate noted  Respiratory: Normal respiratory effort, no problems with respiration noted  Abdomen: Soft, gravid, appropriate for gestational age.  Pain/Pressure: Absent     Pelvic: Cervical exam deferred        Extremities: Normal range of motion.  Edema: Trace  Mental Status: Normal mood and affect. Normal behavior. Normal judgment and thought content.   Assessment and Plan:  Pregnancy: G1P0000 at [redacted]w[redacted]d  1. Supervision of normal first pregnancy, antepartum Pt pulse today 120's- discussed hydration- drink 5-6 16.9 oz bottles of water daily.   Recheck pulse in 1 week.   Edema reported in feet, none currently at this visit.  Denies any s/sx of preeclampsia.   Urine - + 1 Leuk  - Urinalysis (Urine Dip)   Preterm labor symptoms and general  obstetric precautions including but not limited to vaginal bleeding, contractions, leaking of fluid and fetal movement were reviewed in detail with the patient. Please refer to After Visit Summary for other counseling recommendations.  Return in about 1 week (around 06/26/2021) for Pulse check .  Future Appointments  Date Time Provider Department Center  06/20/2021 10:40 AM [facility] PROVIDER [facility] None   V. Olmedo used for [place] interpretation.     Wendi Snipes, FNP

## 2021-06-20 ENCOUNTER — Ambulatory Visit: Payer: Self-pay

## 2021-06-27 ENCOUNTER — Telehealth: Payer: Self-pay | Admitting: Family Medicine

## 2021-06-27 ENCOUNTER — Ambulatory Visit: Payer: Self-pay

## 2021-06-27 NOTE — Telephone Encounter (Signed)
Contacted patient regarding her symptoms. Patient states her husband did test positive for COVID and her symptoms started 06/26/21 which include headache, phlegm, and head cold. Patient states she does have the "Safe Medications in Pregnancy" list and she can take medications from there accordingly. Patient reports positive fetal movement and no OB problems. Recommend to patient to take COVID test and if she needs a test that [facility] has free test near the elevator entrance. Patient encourage to call for any questions. Patient educated to continue taking plenty of water.  OB problem to check HR reschedule for next week 07/04/21.   [name], RN

## 2021-07-04 ENCOUNTER — Ambulatory Visit: Payer: Self-pay | Admitting: Advanced Practice Midwife

## 2021-07-04 ENCOUNTER — Other Ambulatory Visit: Payer: Self-pay

## 2021-07-04 VITALS — BP 109/72 | HR 102 | Temp 98.8°F | Wt 204.6 lb

## 2021-07-04 DIAGNOSIS — O98513 Other viral diseases complicating pregnancy, third trimester: Secondary | ICD-10-CM | POA: Insufficient documentation

## 2021-07-04 DIAGNOSIS — Z3400 Encounter for supervision of normal first pregnancy, unspecified trimester: Secondary | ICD-10-CM

## 2021-07-04 DIAGNOSIS — U071 COVID-19: Secondary | ICD-10-CM | POA: Insufficient documentation

## 2021-07-04 DIAGNOSIS — O99213 Obesity complicating pregnancy, third trimester: Secondary | ICD-10-CM

## 2021-07-04 DIAGNOSIS — O99210 Obesity complicating pregnancy, unspecified trimester: Secondary | ICD-10-CM

## 2021-07-04 DIAGNOSIS — Z3403 Encounter for supervision of normal first pregnancy, third trimester: Secondary | ICD-10-CM

## 2021-07-04 NOTE — Progress Notes (Signed)
   PRENATAL VISIT NOTE  Subjective:  Selena Chavez is a 20 y.o. G1P0000 at [redacted]w[redacted]d being seen today for ongoing prenatal care.  She is currently monitored for the following issues for this low-risk pregnancy and has Supervision of normal first pregnancy, antepartum; Obesity affecting pregnancy, antepartum BMI=31.1; and COVID-19 affecting pregnancy in third trimester 06/25/21 on their problem list.  Patient reports no complaints.  Contractions: Not present. Vag. Bleeding: None.  Movement: Present. Denies leaking of fluid/ROM.   The following portions of the patient's history were reviewed and updated as appropriate: allergies, current medications, past family history, past medical history, past social history, past surgical history and problem list. Problem list updated.  Objective:   Vitals:   07/04/21 0832  BP: 109/72  Pulse: (!) 102  Temp: 98.8 F (37.1 C)  Weight: 204 lb 9.6 oz (92.8 kg)    Fetal Status: Fetal Heart Rate (bpm): 120 Fundal Height: 36 cm Movement: Present  Presentation: Vertex  General:  Alert, oriented and cooperative. Patient is in no acute distress.  Skin: Skin is warm and dry. No rash noted.   Cardiovascular: Normal heart rate noted  Respiratory: Normal respiratory effort, no problems with respiration noted  Abdomen: Soft, gravid, appropriate for gestational age.  Pain/Pressure: Absent     Pelvic: Cervical exam deferred        Extremities: Normal range of motion.  Edema: None  Mental Status: Normal mood and affect. Normal behavior. Normal judgment and thought content.   Assessment and Plan:  Pregnancy: G1P0000 at [redacted]w[redacted]d  1. COVID-19 affecting pregnancy in third trimester 06/25/21  2. Supervision of normal first pregnancy, antepartum Knows when to go to L&D Has car seat and ready for baby at home P=102 today; has been increasing fluids daily Last apt P=120 and time before P=111 BP 109/72 Needs 36 wk labs next week  3. Obesity affecting pregnancy,  antepartum BMI=31.1 28 lb 9.6 oz (13 kg) Walking 2x/wk x 20 min Counseled to stop ASA tomorrow 1 hour glucola 05/08/21=85   Preterm labor symptoms and general obstetric precautions including but not limited to vaginal bleeding, contractions, leaking of fluid and fetal movement were reviewed in detail with the patient. Please refer to After Visit Summary for other counseling recommendations.  Return in about 1 week (around 07/11/2021) for routine PNC.  Future Appointments  Date Time Provider Department Center  07/11/2021  8:40 AM [facility] PROVIDER [facility] None    Alberteen Spindle, CNM

## 2021-07-04 NOTE — Progress Notes (Addendum)
Patient here at 35w 5d for MH RV and HR check as patient was having increasing HR past few weeks, no other symptoms.  Patient states she has increased her water intake to 5-6 bottles of water a day. Patient did test positive for Covid 19 last week along with her husband,her symptom onset 06/26/21. Mask given today during MH RV. Patient states she only had minor cold-like symptoms and tested negative prior to today's visit.   36 week packet given and PHQ9/Abuse screen forms filled today however she is a few days too early for 36 week labs.   [name], RN

## 2021-07-11 ENCOUNTER — Other Ambulatory Visit: Payer: Self-pay

## 2021-07-11 ENCOUNTER — Ambulatory Visit: Payer: Self-pay | Admitting: Advanced Practice Midwife

## 2021-07-11 VITALS — BP 113/69 | HR 88 | Temp 98.0°F | Wt 205.6 lb

## 2021-07-11 DIAGNOSIS — O98513 Other viral diseases complicating pregnancy, third trimester: Secondary | ICD-10-CM

## 2021-07-11 DIAGNOSIS — U071 COVID-19: Secondary | ICD-10-CM

## 2021-07-11 DIAGNOSIS — O99210 Obesity complicating pregnancy, unspecified trimester: Secondary | ICD-10-CM

## 2021-07-11 DIAGNOSIS — Z3400 Encounter for supervision of normal first pregnancy, unspecified trimester: Secondary | ICD-10-CM

## 2021-07-11 NOTE — Progress Notes (Signed)
Patient here for MH RV at 36 5/7. 36 week labs today and packet given and reviewed with patient. Patient prefers provider to collect labs.Burt Knack, RN

## 2021-07-11 NOTE — Progress Notes (Signed)
   PRENATAL VISIT NOTE  Subjective:  Selena Chavez is a 20 y.o. G1P0000 at [redacted]w[redacted]d being seen today for ongoing prenatal care.  She is currently monitored for the following issues for this low-risk pregnancy and has Supervision of normal first pregnancy, antepartum; Obesity affecting pregnancy, antepartum BMI=31.1; and COVID-19 affecting pregnancy in third trimester 06/25/21 on their problem list.  Patient reports no complaints.  Contractions: Not present. Vag. Bleeding: None.  Movement: Present. Denies leaking of fluid/ROM.   The following portions of the patient's history were reviewed and updated as appropriate: allergies, current medications, past family history, past medical history, past social history, past surgical history and problem list. Problem list updated.  Objective:   Vitals:   07/11/21 0903  BP: 113/69  Pulse: 88  Temp: 98 F (36.7 C)  Weight: 205 lb 9.6 oz (93.3 kg)    Fetal Status: Fetal Heart Rate (bpm): 130 Fundal Height: 36 cm Movement: Present  Presentation: Vertex  General:  Alert, oriented and cooperative. Patient is in no acute distress.  Skin: Skin is warm and dry. No rash noted.   Cardiovascular: Normal heart rate noted  Respiratory: Normal respiratory effort, no problems with respiration noted  Abdomen: Soft, gravid, appropriate for gestational age.  Pain/Pressure: Absent     Pelvic: Cervical exam deferred        Extremities: Normal range of motion.  Edema: None  Mental Status: Normal mood and affect. Normal behavior. Normal judgment and thought content.   Assessment and Plan:  Pregnancy: G1P0000 at [redacted]w[redacted]d  1. Supervision of normal first pregnancy, antepartum 36 wk cultures collected by provider Not working. Has car seat and ready for baby at home. Knows when to go to L&D P=98 - Chlamydia/GC NAA, Confirmation - Culture, beta strep (group b only)  2. Obesity affecting pregnancy, antepartum BMI=31.1 29 lb 9.6 oz (13.4 kg) Stopped taking ASA 81  mg daily Walking 3x/wk x 30 min  3. COVID-19 affecting pregnancy in third trimester 06/25/21 resolved   Preterm labor symptoms and general obstetric precautions including but not limited to vaginal bleeding, contractions, leaking of fluid and fetal movement were reviewed in detail with the patient. Please refer to After Visit Summary for other counseling recommendations.  No follow-ups on file.  No future appointments.  Alberteen Spindle, CNM

## 2021-07-13 LAB — CHLAMYDIA/GC NAA, CONFIRMATION
Chlamydia trachomatis, NAA: NEGATIVE
Neisseria gonorrhoeae, NAA: NEGATIVE

## 2021-07-15 LAB — CULTURE, BETA STREP (GROUP B ONLY): Strep Gp B Culture: POSITIVE — AB

## 2021-07-16 DIAGNOSIS — B951 Streptococcus, group B, as the cause of diseases classified elsewhere: Secondary | ICD-10-CM

## 2021-07-16 HISTORY — DX: Streptococcus, group b, as the cause of diseases classified elsewhere: B95.1

## 2021-07-17 ENCOUNTER — Ambulatory Visit: Payer: Self-pay | Admitting: Family Medicine

## 2021-07-17 ENCOUNTER — Other Ambulatory Visit: Payer: Self-pay

## 2021-07-17 VITALS — BP 127/75 | HR 96 | Temp 98.4°F | Wt 209.0 lb

## 2021-07-17 DIAGNOSIS — O99213 Obesity complicating pregnancy, third trimester: Secondary | ICD-10-CM

## 2021-07-17 DIAGNOSIS — B951 Streptococcus, group B, as the cause of diseases classified elsewhere: Secondary | ICD-10-CM

## 2021-07-17 DIAGNOSIS — Z3400 Encounter for supervision of normal first pregnancy, unspecified trimester: Secondary | ICD-10-CM

## 2021-07-17 DIAGNOSIS — O99210 Obesity complicating pregnancy, unspecified trimester: Secondary | ICD-10-CM

## 2021-07-17 DIAGNOSIS — Z3403 Encounter for supervision of normal first pregnancy, third trimester: Secondary | ICD-10-CM

## 2021-07-17 NOTE — Progress Notes (Signed)
   PRENATAL VISIT NOTE  Subjective:  Selena Chavez is a 20 y.o. G1P0000 at [redacted]w[redacted]d being seen today for ongoing prenatal care.  She is currently monitored for the following issues for this high-risk pregnancy and has Supervision of normal first pregnancy, antepartum; Obesity affecting pregnancy, antepartum BMI=31.1; COVID-19 affecting pregnancy in third trimester 06/25/21; and Positive GBS test on their problem list.  Patient reports no complaints.  Contractions: Irregular. Vag. Bleeding: None.  Movement: Present. Denies leaking of fluid/ROM.   The following portions of the patient's history were reviewed and updated as appropriate: allergies, current medications, past family history, past medical history, past social history, past surgical history and problem list. Problem list updated.  Objective:   Vitals:   07/17/21 0837  BP: 127/75  Pulse: 96  Temp: 98.4 F (36.9 C)  Weight: 209 lb (94.8 kg)    Fetal Status: Fetal Heart Rate (bpm): 130 Fundal Height: 37 cm Movement: Present  Presentation: Vertex  General:  Alert, oriented and cooperative. Patient is in no acute distress.  Skin: Skin is warm and dry. No rash noted.   Cardiovascular: Normal heart rate noted  Respiratory: Normal respiratory effort, no problems with respiration noted  Abdomen: Soft, gravid, appropriate for gestational age.  Pain/Pressure: Absent     Pelvic: Cervical exam deferred        Extremities: Normal range of motion.  Edema: None  Mental Status: Normal mood and affect. Normal behavior. Normal judgment and thought content.   Assessment and Plan:  Pregnancy: G1P0000 at [redacted]w[redacted]d  1. Positive GBS test Counseled today by me and RN Needs PCN in labor  2. Supervision of normal first pregnancy, antepartum Up to date No complaints Feels ready for baby at home Confirmed desire for Nexplanon.   3. Obesity affecting pregnancy, antepartum BMI=31.1 TWG=33 lb (15 kg) which is well above goal  Confirmed she  stopped ASA  Term labor symptoms and general obstetric precautions including but not limited to vaginal bleeding, contractions, leaking of fluid and fetal movement were reviewed in detail with the patient. Please refer to After Visit Summary for other counseling recommendations.   Return in about 1 week (around 07/24/2021) for Routine prenatal care.  Future Appointments  Date Time Provider Department Center  08/07/2021  9:00 AM [facility] PROVIDER [facility] None    Federico Flake, MD  Federico Flake, MD

## 2021-07-24 ENCOUNTER — Ambulatory Visit: Payer: Self-pay | Admitting: Advanced Practice Midwife

## 2021-07-24 ENCOUNTER — Other Ambulatory Visit: Payer: Self-pay

## 2021-07-24 DIAGNOSIS — O99213 Obesity complicating pregnancy, third trimester: Secondary | ICD-10-CM

## 2021-07-24 DIAGNOSIS — O99210 Obesity complicating pregnancy, unspecified trimester: Secondary | ICD-10-CM

## 2021-07-24 DIAGNOSIS — Z3400 Encounter for supervision of normal first pregnancy, unspecified trimester: Secondary | ICD-10-CM

## 2021-07-24 DIAGNOSIS — Z3403 Encounter for supervision of normal first pregnancy, third trimester: Secondary | ICD-10-CM

## 2021-07-24 NOTE — Progress Notes (Signed)
[facility]  PRENATAL VISIT NOTE  Subjective:  Selena Chavez is a 20 y.o. G1P0000 at [redacted]w[redacted]d being seen today for ongoing prenatal care.  She is currently monitored for the following issues for this low-risk pregnancy and has Supervision of normal first pregnancy, antepartum; Obesity affecting pregnancy, antepartum BMI=31.1; COVID-19 affecting pregnancy in third trimester 06/25/21; and Positive GBS test on their problem list.  Patient reports no complaints.  Contractions: Not present. Vag. Bleeding: None.  Movement: Present. Denies leaking of fluid/ROM.   The following portions of the patient's history were reviewed and updated as appropriate: allergies, current medications, past family history, past medical history, past social history, past surgical history and problem list. Problem list updated.  Objective:  There were no vitals filed for this visit.  Fetal Status: Fetal Heart Rate (bpm): 120 Fundal Height: 37 cm Movement: Present  Presentation: Vertex  General:  Alert, oriented and cooperative. Patient is in no acute distress.  Skin: Skin is warm and dry. No rash noted.   Cardiovascular: Normal heart rate noted  Respiratory: Normal respiratory effort, no problems with respiration noted  Abdomen: Soft, gravid, appropriate for gestational age.  Pain/Pressure: Absent     Pelvic: Cervical exam deferred        Extremities: Normal range of motion.  Edema: None  Mental Status: Normal mood and affect. Normal behavior. Normal judgment and thought content.   Assessment and Plan:  Pregnancy: G1P0000 at [redacted]w[redacted]d  1. Supervision of normal first pregnancy, antepartum Knows when to go to L&D. Ready for baby at home To do IOL paperwork next week due to primigravida  2. Obesity affecting pregnancy, antepartum BMI=31.1 33 lb (15 kg) Stopped taking ASA Walking 3x/wk x 30 min   Term labor symptoms and general obstetric precautions including but not  limited to vaginal bleeding, contractions, leaking of fluid and fetal movement were reviewed in detail with the patient. Please refer to After Visit Summary for other counseling recommendations.  Return in about 1 week (around 07/31/2021) for routine PNC.  Future Appointments  Date Time Provider Department Center  07/31/2021  8:20 AM [facility] PROVIDER [facility] None    Alberteen Spindle, CNM

## 2021-07-27 NOTE — Progress Notes (Signed)
Circumcision information given to patient to acquire about on 07/24/21. Patient will talk to het significant other about decision - primary financially motivated as patient was concern of cost. Informed patient to [facility] cost for [facility].   [name], RN

## 2021-07-31 ENCOUNTER — Other Ambulatory Visit: Payer: Self-pay

## 2021-07-31 ENCOUNTER — Ambulatory Visit: Payer: Self-pay | Admitting: Advanced Practice Midwife

## 2021-07-31 VITALS — BP 109/66 | HR 87 | Temp 97.3°F | Wt 210.4 lb

## 2021-07-31 DIAGNOSIS — O99213 Obesity complicating pregnancy, third trimester: Secondary | ICD-10-CM

## 2021-07-31 DIAGNOSIS — Z3400 Encounter for supervision of normal first pregnancy, unspecified trimester: Secondary | ICD-10-CM

## 2021-07-31 DIAGNOSIS — Z3403 Encounter for supervision of normal first pregnancy, third trimester: Secondary | ICD-10-CM

## 2021-07-31 DIAGNOSIS — Z23 Encounter for immunization: Secondary | ICD-10-CM

## 2021-07-31 DIAGNOSIS — O99210 Obesity complicating pregnancy, unspecified trimester: Secondary | ICD-10-CM

## 2021-07-31 NOTE — Progress Notes (Signed)
Referral for IOL appt faxed to [facility] with snapshot pages and fax confirmation received. Jossie Ng, RN

## 2021-07-31 NOTE — Progress Notes (Signed)
[facility]  PRENATAL VISIT NOTE  Subjective:  Selena Chavez is a 20 y.o. G1P0000 at [redacted]w[redacted]d being seen today for ongoing prenatal care.  She is currently monitored for the following issues for this low-risk pregnancy and has Supervision of normal first pregnancy, antepartum; Obesity affecting pregnancy, antepartum BMI=31.1; COVID-19 affecting pregnancy in third trimester 06/25/21; and Positive GBS test on their problem list.  Patient reports no complaints.  Contractions: Not present. Vag. Bleeding: None.  Movement: Present. Denies leaking of fluid/ROM.   The following portions of the patient's history were reviewed and updated as appropriate: allergies, current medications, past family history, past medical history, past social history, past surgical history and problem list. Problem list updated.  Objective:   Vitals:   07/31/21 0820  BP: 109/66  Pulse: 87  Temp: (!) 97.3 F (36.3 C)  Weight: 210 lb 6.4 oz (95.4 kg)    Fetal Status: Fetal Heart Rate (bpm): 130 Fundal Height: 40 cm Movement: Present  Presentation: Vertex  General:  Alert, oriented and cooperative. Patient is in no acute distress.  Skin: Skin is warm and dry. No rash noted.   Cardiovascular: Normal heart rate noted  Respiratory: Normal respiratory effort, no problems with respiration noted  Abdomen: Soft, gravid, appropriate for gestational age.  Pain/Pressure: Absent     Pelvic: Cervical exam performed Dilation: 1 Effacement (%): Thick Station: -3  Extremities: Normal range of motion.  Edema: None  Mental Status: Normal mood and affect. Normal behavior. Normal judgment and thought content.   Assessment and Plan:  Pregnancy: G1P0000 at [redacted]w[redacted]d  1. Supervision of normal first pregnancy, antepartum Knows when to go to L&D; has car seat and ready for baby at home IOL paperwork completed and to be faxed; pt chooses 08/10/21 date  2. Obesity affecting pregnancy,  antepartum BMI=31.1 Not taking ASA 81 mg anymore 34 lb 6.4 oz (15.6 kg)    Term labor symptoms and general obstetric precautions including but not limited to vaginal bleeding, contractions, leaking of fluid and fetal movement were reviewed in detail with the patient. Please refer to After Visit Summary for other counseling recommendations.  Return in about 1 week (around 08/07/2021) for routine PNC.  No future appointments.  Alberteen Spindle, CNM

## 2021-08-01 ENCOUNTER — Telehealth: Payer: Self-pay

## 2021-08-01 NOTE — Telephone Encounter (Signed)
Patient called back and was informed of IOL date and time to arrive (08/10/2021 at 0745 at the [facility] ED).  Patient reminded of MH RV appointment on 08/07/2021 as well. Patient states understanding and denies questions.Burt Knack, RN

## 2021-08-01 NOTE — Telephone Encounter (Signed)
TC to patient to inform of [facility] IOL on 08/10/2021 at 0800. Will need to arrive [facility] ED at 0745. LM with number to call. TC to cell phone number, unable to LM, no VM set up. Interpreter M. [place].Burt Knack, RN

## 2021-08-07 ENCOUNTER — Ambulatory Visit: Payer: Self-pay | Admitting: Family Medicine

## 2021-08-07 ENCOUNTER — Other Ambulatory Visit: Payer: Self-pay

## 2021-08-07 VITALS — BP 109/71 | HR 90 | Temp 98.0°F | Wt 211.8 lb

## 2021-08-07 DIAGNOSIS — Z3400 Encounter for supervision of normal first pregnancy, unspecified trimester: Secondary | ICD-10-CM

## 2021-08-07 DIAGNOSIS — Z3403 Encounter for supervision of normal first pregnancy, third trimester: Secondary | ICD-10-CM

## 2021-08-07 NOTE — Progress Notes (Signed)
[facility]  PRENATAL VISIT NOTE  Subjective:  Selena Chavez is a 20 y.o. G1P0000 at [redacted]w[redacted]d being seen today for ongoing prenatal care.  She is currently monitored for the following issues for this low-risk pregnancy and has Supervision of normal first pregnancy, antepartum; Obesity affecting pregnancy, antepartum BMI=31.1; COVID-19 affecting pregnancy in third trimester 06/25/21; and Positive GBS test on their problem list.  Patient reports no complaints.  Contractions: Irritability. Vag. Bleeding: None.  Movement: Present. Denies leaking of fluid/ROM.   The following portions of the patient's history were reviewed and updated as appropriate: allergies, current medications, past family history, past medical history, past social history, past surgical history and problem list. Problem list updated.  Objective:   Vitals:   08/07/21 0933  BP: 109/71  Pulse: 90  Temp: 98 F (36.7 C)  Weight: 211 lb 12.8 oz (96.1 kg)    Fetal Status: Fetal Heart Rate (bpm): 133 Fundal Height: 41 cm Movement: Present  Presentation: Vertex  General:  Alert, oriented and cooperative. Patient is in no acute distress.  Skin: Skin is warm and dry. No rash noted.   Cardiovascular: Normal heart rate noted  Respiratory: Normal respiratory effort, no problems with respiration noted  Abdomen: Soft, gravid, appropriate for gestational age.  Pain/Pressure: Absent     Pelvic: Cervical exam performed Dilation: 2 Effacement (%): 30 Station: -3  Extremities: Normal range of motion.  Edema: None  Mental Status: Normal mood and affect. Normal behavior. Normal judgment and thought content.   Assessment and Plan:  Pregnancy: G1P0000 at [redacted]w[redacted]d  1. Supervision of normal first pregnancy, antepartum -pt is aware of time of IOL - patient desires cervical check today  -pt aware of s/sx of labor and when to go to hospital if starts labor before IOL date.   -discussed  stimulation of nipples, walking and sex to help induce labor before date.   -discussed setting up appointment for postpartum exam around after 6 weeks PP.     Term labor symptoms and general obstetric precautions including but not limited to vaginal bleeding, contractions, leaking of fluid and fetal movement were reviewed in detail with the patient. Please refer to After Visit Summary for other counseling recommendations.  No follow-ups on file.  No future appointments.  Wendi Snipes, FNP

## 2021-08-07 NOTE — Progress Notes (Signed)
Patient here at 52 4/7. Aware of [facility] IOL on 08/10/2021 at 0800.[name] KitchenMarland KitchenBurt Knack, RN

## 2021-08-10 ENCOUNTER — Encounter: Payer: Self-pay | Admitting: Obstetrics and Gynecology

## 2021-08-10 ENCOUNTER — Other Ambulatory Visit: Payer: Self-pay

## 2021-08-10 ENCOUNTER — Inpatient Hospital Stay: Payer: Medicaid Other | Admitting: Anesthesiology

## 2021-08-10 ENCOUNTER — Inpatient Hospital Stay
Admission: RE | Admit: 2021-08-10 | Discharge: 2021-08-13 | DRG: 806 | Disposition: A | Discharge status: Home / Self Care | Payer: Medicaid Other | Attending: Obstetrics and Gynecology | Admitting: Obstetrics and Gynecology

## 2021-08-10 DIAGNOSIS — O98513 Other viral diseases complicating pregnancy, third trimester: Secondary | ICD-10-CM | POA: Diagnosis present

## 2021-08-10 DIAGNOSIS — O99824 Streptococcus B carrier state complicating childbirth: Secondary | ICD-10-CM | POA: Diagnosis present

## 2021-08-10 DIAGNOSIS — O99210 Obesity complicating pregnancy, unspecified trimester: Secondary | ICD-10-CM

## 2021-08-10 DIAGNOSIS — O480 Post-term pregnancy: Secondary | ICD-10-CM | POA: Diagnosis present

## 2021-08-10 DIAGNOSIS — Z3400 Encounter for supervision of normal first pregnancy, unspecified trimester: Secondary | ICD-10-CM

## 2021-08-10 DIAGNOSIS — E669 Obesity, unspecified: Secondary | ICD-10-CM

## 2021-08-10 DIAGNOSIS — Z3490 Encounter for supervision of normal pregnancy, unspecified, unspecified trimester: Secondary | ICD-10-CM | POA: Diagnosis present

## 2021-08-10 DIAGNOSIS — O99214 Obesity complicating childbirth: Secondary | ICD-10-CM | POA: Diagnosis present

## 2021-08-10 DIAGNOSIS — Z20822 Contact with and (suspected) exposure to covid-19: Secondary | ICD-10-CM | POA: Diagnosis present

## 2021-08-10 DIAGNOSIS — Z3A41 41 weeks gestation of pregnancy: Secondary | ICD-10-CM

## 2021-08-10 DIAGNOSIS — O9081 Anemia of the puerperium: Secondary | ICD-10-CM | POA: Diagnosis not present

## 2021-08-10 DIAGNOSIS — Z8616 Personal history of COVID-19: Secondary | ICD-10-CM

## 2021-08-10 DIAGNOSIS — D62 Acute posthemorrhagic anemia: Secondary | ICD-10-CM | POA: Diagnosis not present

## 2021-08-10 DIAGNOSIS — O99213 Obesity complicating pregnancy, third trimester: Secondary | ICD-10-CM

## 2021-08-10 DIAGNOSIS — B951 Streptococcus, group B, as the cause of diseases classified elsewhere: Secondary | ICD-10-CM

## 2021-08-10 DIAGNOSIS — O9882 Other maternal infectious and parasitic diseases complicating childbirth: Secondary | ICD-10-CM

## 2021-08-10 DIAGNOSIS — U071 COVID-19: Secondary | ICD-10-CM | POA: Diagnosis present

## 2021-08-10 LAB — RESP PANEL BY RT-PCR (FLU A&B, COVID) ARPGX2
Influenza A by PCR: NEGATIVE
Influenza B by PCR: NEGATIVE
SARS Coronavirus 2 by RT PCR: NEGATIVE

## 2021-08-10 LAB — TYPE AND SCREEN
ABO/RH(D): O POS
Antibody Screen: NEGATIVE

## 2021-08-10 LAB — CBC
HCT: 37.4 % (ref 36.0–46.0)
Hemoglobin: 13.3 g/dL (ref 12.0–15.0)
MCH: 32 pg (ref 26.0–34.0)
MCHC: 35.6 g/dL (ref 30.0–36.0)
MCV: 89.9 fL (ref 80.0–100.0)
Platelets: 231 10*3/uL (ref 150–400)
RBC: 4.16 MIL/uL (ref 3.87–5.11)
RDW: 13.2 % (ref 11.5–15.5)
WBC: 7.2 10*3/uL (ref 4.0–10.5)
nRBC: 0 % (ref 0.0–0.2)

## 2021-08-10 LAB — ABO/RH: ABO/RH(D): O POS

## 2021-08-10 MED ORDER — TERBUTALINE SULFATE 1 MG/ML IJ SOLN
0.2500 mg | Freq: Once | INTRAMUSCULAR | Status: DC | PRN
Start: 2021-08-10 — End: 2021-08-11

## 2021-08-10 MED ORDER — LACTATED RINGERS IV SOLN: 500.0000 mL | INTRAVENOUS | Status: DC | PRN

## 2021-08-10 MED ORDER — SOD CITRATE-CITRIC ACID 500-334 MG/5ML PO SOLN: 30.0000 mL | ORAL | Status: DC | PRN

## 2021-08-10 MED ORDER — FENTANYL-BUPIVACAINE-NACL 0.5-0.125-0.9 MG/250ML-% EP SOLN
12.0000 mL/h | EPIDURAL | Status: DC | PRN
Start: 2021-08-10 — End: 2021-08-11
  Administered 2021-08-10: 12 mL/h via EPIDURAL

## 2021-08-10 MED ORDER — EPHEDRINE 5 MG/ML INJ
10.0000 mg | INTRAVENOUS | Status: DC | PRN
Start: 2021-08-10 — End: 2021-08-11

## 2021-08-10 MED ORDER — LIDOCAINE HCL (PF) 1 % IJ SOLN: 30.0000 mL | INTRAMUSCULAR | Status: DC | PRN

## 2021-08-10 MED ORDER — TERBUTALINE SULFATE 1 MG/ML IJ SOLN: 0.2500 mg | Freq: Once | INTRAMUSCULAR | Status: DC | PRN

## 2021-08-10 MED ORDER — OXYTOCIN-SODIUM CHLORIDE 30-0.9 UT/500ML-% IV SOLN: 1.0000 m[IU]/min | INTRAVENOUS | Status: DC

## 2021-08-10 MED ORDER — AMMONIA AROMATIC IN INHA
RESPIRATORY_TRACT | Status: AC
  Filled 2021-08-10: qty 10

## 2021-08-10 MED ORDER — OXYTOCIN BOLUS FROM INFUSION
333.0000 mL | Freq: Once | INTRAVENOUS | Status: AC
  Administered 2021-08-11: 333 mL via INTRAVENOUS

## 2021-08-10 MED ORDER — PHENYLEPHRINE 40 MCG/ML (10ML) SYRINGE FOR IV PUSH (FOR BLOOD PRESSURE SUPPORT): 80.0000 ug | PREFILLED_SYRINGE | INTRAVENOUS | Status: DC | PRN

## 2021-08-10 MED ORDER — LACTATED RINGERS IV SOLN: INTRAVENOUS | Status: DC

## 2021-08-10 MED ORDER — MISOPROSTOL 25 MCG QUARTER TABLET
25.0000 ug | ORAL_TABLET | ORAL | Status: DC | PRN
  Filled 2021-08-10 (×2): qty 1

## 2021-08-10 MED ORDER — OXYTOCIN 10 UNIT/ML IJ SOLN: 10.0000 [IU] | Freq: Once | INTRAMUSCULAR | Status: DC

## 2021-08-10 MED ORDER — PENICILLIN G POT IN DEXTROSE 60000 UNIT/ML IV SOLN
3.0000 10*6.[IU] | INTRAVENOUS | Status: DC
  Administered 2021-08-10 (×3): 3 10*6.[IU] via INTRAVENOUS
  Filled 2021-08-10 (×3): qty 50

## 2021-08-10 MED ORDER — ONDANSETRON HCL 4 MG/2ML IJ SOLN: 4.0000 mg | Freq: Four times a day (QID) | INTRAMUSCULAR | Status: DC | PRN

## 2021-08-10 MED ORDER — SODIUM CHLORIDE 0.9 % IV SOLN
INTRAVENOUS | Status: DC | PRN
  Administered 2021-08-10 (×2): 5 mL via EPIDURAL

## 2021-08-10 MED ORDER — LIDOCAINE HCL (PF) 1 % IJ SOLN
INTRAMUSCULAR | Status: DC | PRN
  Administered 2021-08-10: 3 mL via SUBCUTANEOUS

## 2021-08-10 MED ORDER — OXYTOCIN-SODIUM CHLORIDE 30-0.9 UT/500ML-% IV SOLN
2.5000 [IU]/h | INTRAVENOUS | Status: DC
  Administered 2021-08-11: 2.5 [IU]/h via INTRAVENOUS
  Filled 2021-08-10: qty 1000

## 2021-08-10 MED ORDER — LIDOCAINE HCL (PF) 1 % IJ SOLN
INTRAMUSCULAR | Status: AC
  Filled 2021-08-10: qty 30

## 2021-08-10 MED ORDER — DIPHENHYDRAMINE HCL 50 MG/ML IJ SOLN: 12.5000 mg | INTRAMUSCULAR | Status: DC | PRN

## 2021-08-10 MED ORDER — SODIUM CHLORIDE 0.9 % IV SOLN
5.0000 10*6.[IU] | Freq: Once | INTRAVENOUS | Status: AC
  Administered 2021-08-10: 5 10*6.[IU] via INTRAVENOUS
  Filled 2021-08-10: qty 5

## 2021-08-10 MED ORDER — LIDOCAINE-EPINEPHRINE (PF) 1.5 %-1:200000 IJ SOLN
INTRAMUSCULAR | Status: DC | PRN
  Administered 2021-08-10: 3 mL via EPIDURAL

## 2021-08-10 MED ORDER — MISOPROSTOL 200 MCG PO TABS
ORAL_TABLET | ORAL | Status: AC
  Administered 2021-08-10: 25 ug via VAGINAL
  Filled 2021-08-10: qty 4

## 2021-08-10 MED ORDER — FENTANYL-BUPIVACAINE-NACL 0.5-0.125-0.9 MG/250ML-% EP SOLN
EPIDURAL | Status: AC
  Filled 2021-08-10: qty 250

## 2021-08-10 MED ORDER — LACTATED RINGERS IV SOLN
500.0000 mL | Freq: Once | INTRAVENOUS | Status: AC
  Administered 2021-08-10: 500 mL via INTRAVENOUS

## 2021-08-10 MED ORDER — OXYTOCIN 10 UNIT/ML IJ SOLN
INTRAMUSCULAR | Status: AC
  Filled 2021-08-10: qty 2

## 2021-08-10 NOTE — Anesthesia Procedure Notes (Signed)
Epidural Patient location during procedure: OB Start time: 08/10/2021 9:01 PM End time: 08/10/2021 9:07 PM  Staffing Anesthesiologist: Lenard Simmer, MD Performed: anesthesiologist   Preanesthetic Checklist Completed: patient identified, IV checked, site marked, risks and benefits discussed, surgical consent, monitors and equipment checked, pre-op evaluation and timeout performed  Epidural Patient position: sitting Prep: ChloraPrep Patient monitoring: heart rate, continuous pulse ox and blood pressure Approach: midline Location: L2-L3 Injection technique: LOR saline  Needle:  Needle type: Tuohy  Needle gauge: 17 G Needle length: 9 cm and 9 Needle insertion depth: 5 cm Catheter type: closed end flexible Catheter size: 19 Gauge Catheter at skin depth: 10 cm Test dose: negative and 1.5% lidocaine with Epi 1:200 K  Assessment Sensory level: T10 Events: blood not aspirated, injection not painful, no injection resistance, no paresthesia and negative IV test  Additional Notes 1st attempt Pt. Evaluated and documentation done after procedure finished. Patient identified. Risks/Benefits/Options discussed with patient including but not limited to bleeding, infection, nerve damage, paralysis, failed block, incomplete pain control, headache, blood pressure changes, nausea, vomiting, reactions to medication both or allergic, itching and postpartum back pain. Confirmed with bedside nurse the patient's most recent platelet count. Confirmed with patient that they are not currently taking any anticoagulation, have any bleeding history or any family history of bleeding disorders. Patient expressed understanding and wished to proceed. All questions were answered. Sterile technique was used throughout the entire procedure. Please see nursing notes for vital signs. Test dose was given through epidural catheter and negative prior to continuing to dose epidural or start infusion. Warning signs of high  block given to the patient including shortness of breath, tingling/numbness in hands, complete motor block, or any concerning symptoms with instructions to call for help. Patient was given instructions on fall risk and not to get out of bed. All questions and concerns addressed with instructions to call with any issues or inadequate analgesia.    Patient tolerated the insertion well without immediate complications.Reason for block:procedure for pain

## 2021-08-10 NOTE — H&P (Signed)
OB History & Physical   History of Present Illness:  Chief Complaint: Here for induction of labor due to post-dates  HPI:  Selena Chavez is a 20 y.o. G1P0000 female at 60w0ddated by 20 week ultrasound.  Her pregnancy has been complicated by obesity with BMI 38 (initially 31) .    She denies contractions.   She denies leakage of fluid.   She denies vaginal bleeding.   She reports fetal movement.    Total weight gain for pregnancy: 15.9 kg  (35 lbs)  Obstetrical Problem List: Pregnancy 2022 Problems (from 01/29/21 to present)     Problem Noted Resolved   Positive GBS test 07/16/2021 by BJenetta Downer RN No   Overview Signed 07/16/2021  8:19 AM by BJenetta Downer RN    Counsel patient      [id number] pregnancy in third trimester 06/25/21 07/04/2021 by SHerbie Saxon CNM No   Supervision of normal first pregnancy, antepartum 01/30/2021 by GJunious Dresser FNP No   Overview Addendum 07/31/2021  8:17 AM by WRich Number RN     Nursing Staff Provider  Office Location  [facility] Dating   LMP  Language  Spanish Anatomy [place] 03/13/21- AF normal, 3 vessel chord, no fetal anomalies. Female   Flu Vaccine  07/31/2021 Genetic Screen   Quad 02/27/2021=neg   TDaP vaccine   05/08/21 Hgb A1C or  GTT Early 1 hour gtt=73,   01/30/2021  28 week 1 hour gtt= 85, 05/08/2021  COVID vaccine Given;  02/26/20; 03/20/20 Booster: 01/02/21    Rhogam     LAB RESULTS   Feeding Plan Breast and formula Blood Type   O positive                           (01/30/21)  Contraception Nexplanon Antibody   negative                             (01/30/21)  Circumscion no Rubella  MMR x2               (04/13/18; 08/26/18)  [facility]   Non reactive         (01/30/21) (05/08/21)  Support Person Jose Adoni (significant other) HBsAg   negative            (01/30/21)  Prenatal Classes  HIV   Non reactive      (01/30/21) (05/08/21)  @28wk - Doula referral?  Varicella  Varivax x 2             (04/13/18; 08/26/18)    HCV    Negative                            (01/30/21)  BTL Consent  GBS   Positive  (07/11/21)       VBAC Consent  Pap  N/A    Hgb Electro   Normal                              (08/28/18)  BP Cuff ordered  CF   Delivery Group  [facility]  [facility]   Centering Group  OBCM involved          Obesity affecting pregnancy, antepartum BMI=31.1 01/30/2021 by GJunious Dresser FNP No   Overview Addendum 05/22/2021  8:58 AM by Herbie Saxon, CNM    Recommendations [ x] Aspirin 81 mg daily after 12 weeks;taking ASA 81 mg daily; discontinue after 36 weeks [x ] Nutrition consult [x ] Weight gain 11-20 lbs for singleton and 25-35 lbs for twin pregnancy (IOM guidelines) Higher class of obesity patients recommended to gain closer to lower limit  Weight loss is associated with adverse outcomes [ ]  Baseline and surveillance labs (pulled in from [facility], refresh links as needed)  No results found for: PLT, CREATININE, AST, ALT, PROTCRRATIO  Antenatal Testing: Not indicated.  [ ]  Growth scans every 4-6 weeks as needed (fundal height likely inadequate in morbidly obese patients)  Postpartum Care: [ ]  Consider prophylactic wound vac/PICO for C/S [ ]  Lovenox for DVT/PE prophylaxis (6 hours after vaginal delivery, 12 hours after C/S).   Lovenox 40 mg [place] q24h (BMI 30.0-39.9 kg/m2)  Lovenox 0.5 mg/kg [place] q12h ((BMI ?40 kg/m2 ); Max 150 mg [place] q12h.  Consider prolonged therapy x 6 weeks PP in very concerning patients (I.e morbid obesity with other co-morbidities that increase risk of DVT/PE) [ ]  Counsel about diet, exercise and weight loss. Referrals PRN.  ICD10 Codes: O99.210   Obesity in pregnancy (BMI 30.0-39.9 kg/m2)  O99.210, E66.01 Maternal Morbid Obesity (BMI ?40 kg/m2 ).**Have to use both codes, this is a McNary code and risk adjusts/more reimbursement**  Obesity is defined as body mass index (BMI) ?30 kg/m2 .  Class I (BMI 30.0 to 34.9 kg/m2) Class II (BMI 35.0 to 39.9 kg/m2) Class  III/Morbid obesity (BMI ?40 kg/            Maternal Medical History:   Past Medical History:  Diagnosis Date   Patient denies medical problems     Past Surgical History:  Procedure Laterality Date   denies     NO PAST SURGERIES     NO PAST SURGERIES      No Known Allergies  Prior to Admission medications   Medication Sig Start Date End Date Taking? Authorizing Provider  Prenatal Vit-Fe Fumarate-FA (PRENATAL VITAMIN) 27-0.8 MG TABS Take 1 tablet by mouth daily. 05/08/21  Yes Sciora, Real Cons, CNM  aspirin 81 MG chewable tablet Chew by mouth daily. Patient not taking: No sig reported    [provider]    OB History  Gravida Para Term Preterm AB Living  1 0 0 0 0 0  SAB IAB Ectopic Multiple Live Births  0 0 0 0 0    # Outcome Date GA Lbr Len/2nd Weight Sex Delivery Anes PTL Lv  1 Current             Prenatal care site: [facility]  Social History: She  reports that she has never smoked. She has never used smokeless tobacco. She reports that she does not drink alcohol and does not use drugs.  Family History: family history includes Healthy in her father and mother.   Review of Systems  Constitutional: Negative.   HENT: Negative.    Eyes: Negative.   Respiratory: Negative.    Cardiovascular: Negative.   Gastrointestinal: Negative.   Genitourinary: Negative.   Musculoskeletal: Negative.   Skin: Negative.   Neurological: Negative.   Psychiatric/Behavioral: Negative.      Physical Exam:  BP 124/73 (BP Location: Left Arm)   Pulse (!) 131   Temp 98.6 F (37 C) (Oral)   Resp 16   Ht 5' 3"  (1.6 m)   Wt 95.7 kg   LMP 10/21/2020 Comment: normal  BMI 37.38 kg/m   Physical Exam Constitutional:      General: She is not in acute distress.    Appearance: Normal appearance. She is well-developed.  Genitourinary:     Genitourinary Comments: 2/50/-3/medium/posterior  HENT:     Head: Normocephalic and atraumatic.  Eyes:     General: No scleral icterus.     Conjunctiva/sclera: Conjunctivae normal.  Cardiovascular:     Rate and Rhythm: Normal rate and regular rhythm.     Heart sounds: No murmur heard.   No friction rub. No gallop.  Pulmonary:     Effort: Pulmonary effort is normal. No respiratory distress.     Breath sounds: Normal breath sounds. No wheezing or rales.  Abdominal:     General: Bowel sounds are normal. There is no distension.     Palpations: Abdomen is soft. There is mass (gravid, NT).     Tenderness: There is no abdominal tenderness. There is no guarding or rebound.  Musculoskeletal:        General: Normal range of motion.     Cervical back: Normal range of motion and neck supple.  Neurological:     General: No focal deficit present.     Mental Status: She is alert and oriented to person, place, and time.     Cranial Nerves: No cranial nerve deficit.  Skin:    General: Skin is warm and dry.     Findings: No erythema.  Psychiatric:        Mood and Affect: Mood normal.        Behavior: Behavior normal.        Judgment: Judgment normal.   Female chaperone present for pelvic exam    Baseline FHR: 125 beats/min   Variability: moderate   Accelerations: present   Decelerations: absent Contractions: present frequency: 3-4 q 10 minutes (she notes that she does not feel them) Overall assessment: cat 1  Bedside Ultrasound:  Number of Fetus: 1  Presentation: cephalic (spine maternal left)  Fluid: subjectively normal  Placental Location: anterolateral right  No results found for: SARSCOV2NAA - recent history of sars-CoV-2 in August (out of isolation period)  Assessment:  Selena Chavez is a 20 y.o. G1P0000 female at 59w0dwith medical induction of labor due to gestational age of 455 weeks   Plan:  Admit to Labor & Delivery  CBC, T&S, Clrs, IVF GBS positive - PCN per protocol.   Fetwal well-being: reassuring Start with misoprostol 25 mcg until ripened sufficiently. Start pitocin at that point. EFW around 8  pounds. Pelvis unproven, but there does not seem to be evidence her pelvis could not handle the estimated size of the baby.   SPrentice Docker MD 08/10/2021 10:09 AM

## 2021-08-10 NOTE — Anesthesia Preprocedure Evaluation (Signed)
Anesthesia Evaluation  Patient identified by MRN, date of birth, ID band Patient awake    Reviewed: Allergy & Precautions, H&P , NPO status , Patient's Chart, lab work & pertinent test results, reviewed documented beta blocker date and time   History of Anesthesia Complications Negative for: history of anesthetic complications  Airway Mallampati: III  TM Distance: >3 FB Neck ROM: full    Dental no notable dental hx.    Pulmonary neg pulmonary ROS,    Pulmonary exam normal breath sounds clear to auscultation       Cardiovascular Exercise Tolerance: Good negative cardio ROS Normal cardiovascular exam Rhythm:regular Rate:Normal     Neuro/Psych negative neurological ROS  negative psych ROS   GI/Hepatic Neg liver ROS, GERD  ,  Endo/Other  negative endocrine ROS  Renal/GU negative Renal ROS  negative genitourinary   Musculoskeletal   Abdominal   Peds  Hematology negative hematology ROS (+)   Anesthesia Other Findings Past Medical History: No date: Patient denies medical problems   Reproductive/Obstetrics (+) Pregnancy                             Anesthesia Physical Anesthesia Plan  ASA: 2  Anesthesia Plan: Epidural   Post-op Pain Management:    Induction:   PONV Risk Score and Plan:   Airway Management Planned:   Additional Equipment:   Intra-op Plan:   Post-operative Plan:   Informed Consent: I have reviewed the patients History and Physical, chart, labs and discussed the procedure including the risks, benefits and alternatives for the proposed anesthesia with the patient or authorized representative who has indicated his/her understanding and acceptance.     Dental Advisory Given  Plan Discussed with: Anesthesiologist, CRNA and Surgeon  Anesthesia Plan Comments:         Anesthesia Quick Evaluation

## 2021-08-10 NOTE — Progress Notes (Signed)
Labor Check  Subj:  Complaints: comfortable with epidural   Obj:  BP 132/77   Pulse 83   Temp 98.4 F (36.9 C) (Oral)   Resp 18   Ht 5\' 3"  (1.6 m)   Wt 95.7 kg   LMP 10/21/2020 Comment: normal  BMI 37.38 kg/m     Cervix: 10 / 100% / +2  Baseline FHR: 120 beats/min   Variability: moderate   Accelerations: present   Decelerations: present - apparently some late decelerations, resolved with position changes, some possible early decelerations. Still with moderate variability and accelerations.  SROM of clear fluid, forebag rupture of clear fluid. Contractions: present frequency: not tracing well, but appear to be 3-4 q 10 min Overall assessment: cat 2 recently, but very reassuring overall  Female chaperone present for pelvic exam:   A/P: 20 y.o. G1P0000 female at [redacted]w[redacted]d with IOL for dates.  1.  Labor: s/p a single dose of cytotec 25 mcg, now really progressing spontaneously. Expect vaginal delivery.  2.  FWB: reassuring, Overall assessment: category 1  3.  GBS positive - PCN  4.  Pain: epidural 5.  Recheck: labor down for 1 hour, then consider pushing now that she's in second stage.     [redacted]w[redacted]d, MD, Thomas[facility] Mohair [facility], [facility] 08/10/2021 10:05 PM

## 2021-08-11 ENCOUNTER — Encounter: Payer: Self-pay | Admitting: Obstetrics and Gynecology

## 2021-08-11 DIAGNOSIS — D62 Acute posthemorrhagic anemia: Secondary | ICD-10-CM

## 2021-08-11 DIAGNOSIS — O99214 Obesity complicating childbirth: Secondary | ICD-10-CM

## 2021-08-11 DIAGNOSIS — O9903 Anemia complicating the puerperium: Secondary | ICD-10-CM

## 2021-08-11 LAB — CBC
HCT: 35.2 % — ABNORMAL LOW (ref 36.0–46.0)
Hemoglobin: 12.1 g/dL (ref 12.0–15.0)
MCH: 30.5 pg (ref 26.0–34.0)
MCHC: 34.4 g/dL (ref 30.0–36.0)
MCV: 88.7 fL (ref 80.0–100.0)
Platelets: 214 10*3/uL (ref 150–400)
RBC: 3.97 MIL/uL (ref 3.87–5.11)
RDW: 13.4 % (ref 11.5–15.5)
WBC: 13.4 10*3/uL — ABNORMAL HIGH (ref 4.0–10.5)
nRBC: 0 % (ref 0.0–0.2)

## 2021-08-11 LAB — MEASLES/MUMPS/RUBELLA IMMUNITY
Mumps IgG: 118 AU/mL (ref >10.9)
Rubella: 8.91 index (ref >0.99)
Rubeola IgG: <13.5 AU/mL — ABNORMAL LOW (ref >16.4)

## 2021-08-11 LAB — RPR: RPR Ser Ql: NONREACTIVE

## 2021-08-11 LAB — VARICELLA ZOSTER ANTIBODY, IGG: Varicella IgG: 188 index (ref >165)

## 2021-08-11 MED ORDER — BENZOCAINE-MENTHOL 20-0.5 % EX AERO
1.0000 "application " | INHALATION_SPRAY | CUTANEOUS | Status: DC | PRN
  Administered 2021-08-11: 1 via TOPICAL
  Filled 2021-08-11: qty 56

## 2021-08-11 MED ORDER — SENNOSIDES-DOCUSATE SODIUM 8.6-50 MG PO TABS
2.0000 | ORAL_TABLET | ORAL | Status: DC
  Administered 2021-08-11 – 2021-08-12 (×3): 2 via ORAL
  Filled 2021-08-11 (×3): qty 2

## 2021-08-11 MED ORDER — ONDANSETRON HCL 4 MG/2ML IJ SOLN: 4.0000 mg | INTRAMUSCULAR | Status: DC | PRN

## 2021-08-11 MED ORDER — FERROUS SULFATE 325 (65 FE) MG PO TABS
325.0000 mg | ORAL_TABLET | Freq: Two times a day (BID) | ORAL | Status: DC
  Administered 2021-08-11 – 2021-08-13 (×6): 325 mg via ORAL
  Filled 2021-08-11 (×6): qty 1

## 2021-08-11 MED ORDER — PRENATAL MULTIVITAMIN CH
1.0000 | ORAL_TABLET | Freq: Every day | ORAL | Status: DC
  Administered 2021-08-11 – 2021-08-13 (×3): 1 via ORAL
  Filled 2021-08-11 (×3): qty 1

## 2021-08-11 MED ORDER — DIPHENHYDRAMINE HCL 25 MG PO CAPS: 25.0000 mg | ORAL_CAPSULE | Freq: Four times a day (QID) | ORAL | Status: DC | PRN

## 2021-08-11 MED ORDER — ACETAMINOPHEN 325 MG PO TABS
650.0000 mg | ORAL_TABLET | ORAL | Status: DC | PRN
  Administered 2021-08-12 – 2021-08-13 (×2): 650 mg via ORAL
  Filled 2021-08-11 (×3): qty 2

## 2021-08-11 MED ORDER — ONDANSETRON HCL 4 MG PO TABS
4.0000 mg | ORAL_TABLET | ORAL | Status: DC | PRN
  Filled 2021-08-11: qty 1

## 2021-08-11 MED ORDER — WITCH HAZEL-GLYCERIN EX PADS: 1.0000 "application " | MEDICATED_PAD | CUTANEOUS | Status: DC | PRN

## 2021-08-11 MED ORDER — DIBUCAINE (PERIANAL) 1 % EX OINT: 1.0000 "application " | TOPICAL_OINTMENT | CUTANEOUS | Status: DC | PRN

## 2021-08-11 MED ORDER — HYDROCODONE-ACETAMINOPHEN 5-325 MG PO TABS: 1.0000 | ORAL_TABLET | Freq: Two times a day (BID) | ORAL | Status: DC | PRN

## 2021-08-11 MED ORDER — IBUPROFEN 600 MG PO TABS
600.0000 mg | ORAL_TABLET | Freq: Four times a day (QID) | ORAL | Status: DC
  Administered 2021-08-11 – 2021-08-13 (×11): 600 mg via ORAL
  Filled 2021-08-11 (×11): qty 1

## 2021-08-11 MED ORDER — COCONUT OIL OIL
1.0000 "application " | TOPICAL_OIL | Status: DC | PRN
  Filled 2021-08-11 (×2): qty 120

## 2021-08-11 MED ORDER — SIMETHICONE 80 MG PO CHEW: 80.0000 mg | CHEWABLE_TABLET | ORAL | Status: DC | PRN

## 2021-08-11 NOTE — Discharge Summary (Signed)
Postpartum Discharge Summary   Patient Name: Selena Chavez DOB: [birth year] MRN: [id number]  Date of admission: 08/10/2021 Delivery date:08/11/2021  Delivering provider: Prentice Docker D  Date of discharge: 08/13/2021  Admitting diagnosis: Encounter for induction of labor [Z34.90] Intrauterine pregnancy: [redacted]w[redacted]d    Secondary diagnosis:  Principal Problem:   Supervision of normal first pregnancy, antepartum Active Problems:   Obesity affecting pregnancy, antepartum BMI=31.1   COVID-19 affecting pregnancy in third trimester 06/25/21   Positive GBS test   Encounter for induction of labor   [redacted] weeks gestation of pregnancy   Postpartum care following vaginal delivery  Additional problems: none    Discharge diagnosis: Term Pregnancy Delivered                                              Post partum procedures: none Augmentation: Cytotec Complications: None  Hospital course: Induction of Labor With Vaginal Delivery   20y.o. yo G1P1001 at 467w1das admitted to the hospital 08/10/2021 for induction of labor.  Indication for induction: Postdates.  Patient had an uncomplicated labor course as follows: Membrane Rupture Time/Date: 9:45 PM ,08/10/2021   Delivery Method:Vaginal, Spontaneous  Episiotomy: None  Lacerations:  Periurethral;1st degree;Vaginal  Details of delivery can be found in separate delivery note.  Patient had a routine postpartum course. Patient is discharged home 08/13/21.  Newborn Data: Birth date:08/11/2021  Birth time:12:06 AM  Gender:Female  Living status:Living  Apgars:8 ,9  Weight:4170 g   Magnesium Sulfate received: No BMZ received: No Rhophylac:No MMR:Yes T-DaP:Given prenatally on 05/08/21 Flu: Yes on 07/31/21 Transfusion:No  Physical exam  Vitals:   08/12/21 1547 08/12/21 1944 08/12/21 2324 08/13/21 0720  BP: 117/75 133/80 116/82 115/74  Pulse: (!) 102 98 80 86  Resp: 16 18 18 15   Temp: 98.4 F (36.9 C) 98.9 F (37.2 C) 98.6 F (37 C) 98.1 F  (36.7 C)  TempSrc: Oral Oral Oral Oral  SpO2: 99% 97% 97% 98%  Weight:      Height:       General: alert, cooperative, and no distress Lochia: appropriate Uterine Fundus: firm Incision: Healing well with no significant drainage, No significant erythema DVT Evaluation: No evidence of DVT seen on physical exam. Negative Homan's sign. Labs: Lab Results  Component Value Date   WBC 13.4 (H) 08/11/2021   HGB 12.1 08/11/2021   HCT 35.2 (L) 08/11/2021   MCV 88.7 08/11/2021   PLT 214 08/11/2021   CMP Latest Ref Rng & Units 01/30/2021  Glucose 65 - 99 mg/dL 78  BUN 6 - 20 mg/dL 4(L)  Creatinine 0.57 - 1.00 mg/dL 0.45(L)  Sodium 134 - 144 mmol/L 137  Potassium 3.5 - 5.2 mmol/L 3.9  Chloride 96 - 106 mmol/L 101  CO2 20 - 29 mmol/L 19(L)  Calcium 8.7 - 10.2 mg/dL 9.4  Total Protein 6.0 - 8.5 g/dL 7.0  Total Bilirubin 0.0 - 1.2 mg/dL <0.2  Alkaline Phos 42 - 106 IU/L 59  AST 0 - 40 IU/L 25  ALT 0 - 32 IU/L 17   Edinburgh Score: Edinburgh Postnatal Depression Scale Screening Tool 08/11/2021  I have been able to laugh and see the funny side of things. 0  I have looked forward with enjoyment to things. 0  I have blamed myself unnecessarily when things went wrong. 0  I have been anxious or worried for no good  reason. 2  I have felt scared or panicky for no good reason. 0  Things have been getting on top of me. 1  I have been so unhappy that I have had difficulty sleeping. 0  I have felt sad or miserable. 0  I have been so unhappy that I have been crying. 0  The thought of harming myself has occurred to me. 0  Edinburgh Postnatal Depression Scale Total 3      After visit meds:  Allergies as of 08/13/2021   No Known Allergies      Medication List     STOP taking these medications    aspirin 81 MG chewable tablet       TAKE these medications    ibuprofen 600 MG tablet Commonly known as: ADVIL Take 1 tablet (600 mg total) by mouth every 6 (six) hours.   Prenatal  Vitamin 27-0.8 MG Tabs Take 1 tablet by mouth daily.       Discharge home in stable condition Infant Feeding: Breast Infant Disposition:home with mother Discharge instruction: per After Visit Summary and Postpartum booklet. Activity: Advance as tolerated. Pelvic rest for 6 weeks.  Diet: routine diet Anticipated Birth Control: Nexplanon Postpartum Appointment:6 weeks Additional Postpartum F/U:  none Future Appointments:No future appointments. Follow up Visit:  [place]. Schedule an appointment as soon as possible for a visit in 6 week(s).   Why: Six weeks postpartum visit Contact information: [address] [place]               SIGNED: Imagene Riches, CNM  08/13/2021 10:09 AM

## 2021-08-11 NOTE — Anesthesia Postprocedure Evaluation (Signed)
Anesthesia Post Note  Patient: Selena Chavez  Procedure(s) Performed: AN AD HOC LABOR EPIDURAL  Patient location during evaluation: Mother Baby Anesthesia Type: Epidural Level of consciousness: awake and alert Pain management: pain level controlled Vital Signs Assessment: post-procedure vital signs reviewed and stable Respiratory status: spontaneous breathing, nonlabored ventilation and respiratory function stable Cardiovascular status: stable Postop Assessment: no headache, no backache and epidural receding Anesthetic complications: no   No notable events documented.   Last Vitals:  Vitals:   08/11/21 0530 08/11/21 0745  BP: 123/80 124/88  Pulse: 88 86  Resp: 16 20  Temp: 36.9 C 36.8 C  SpO2: 97% 98%    Last Pain:  Vitals:   08/11/21 0745  TempSrc: Oral  PainSc:                  Rica Mast

## 2021-08-12 NOTE — Progress Notes (Signed)
Subjective:  Doing well postpartum day 1: She is tolerating regular diet, her pain is controlled with PO medication, she is ambulating and voiding without difficulty. Her newborn is being evaluated for episodes of bradycardia.  Objective:  Vital signs in last 24 hours: Temp:  [97.9 F (36.6 C)-98.4 F (36.9 C)] 97.9 F (36.6 C) (10/09 0735) Pulse Rate:  [83-98] 98 (10/09 0735) Resp:  [16-18] 16 (10/09 0735) BP: (97-123)/(71-101) 117/101 (10/09 0735) SpO2:  [96 %-100 %] 99 % (10/09 0735)    General: NAD Pulmonary: no increased work of breathing Abdomen: non-distended, non-tender, fundus firm at level of umbilicus Extremities: no edema, no erythema, no tenderness  Results for orders placed or performed during the hospital encounter of 08/10/21 (from the past 72 hour(s))  CBC     Status: None   Collection Time: 08/10/21  9:24 AM  Result Value Ref Range   WBC 7.2 4.0 - 10.5 K/uL   RBC 4.16 3.87 - 5.11 MIL/uL   Hemoglobin 13.3 12.0 - Selena.0 g/dL   HCT 56.4 33.2 - 95.1 %   MCV 89.9 80.0 - 100.0 fL   MCH 32.0 26.0 - 34.0 pg   MCHC 35.6 30.0 - 36.0 g/dL   RDW 88.4 16.6 - 06.3 %   Platelets 231 150 - 400 K/uL   nRBC 0.0 0.0 - 0.2 %    Comment: Performed at [facility], [address] [place], [place] [address]  Type and screen [facility]     Status: None   Collection Time: 08/10/21  9:24 AM  Result Value Ref Range   ABO/RH(D) O POS    Antibody Screen NEG    Sample Expiration      08/13/2021,2359 Performed at [facility], [address] [place], [place] [address]   RPR     Status: None   Collection Time: 08/10/21  9:24 AM  Result Value Ref Range   RPR Ser Ql NON REACTIVE NON REACTIVE    Comment: Performed at [facility], [address] [place], [place] [address]  Resp Panel by RT-PCR (Flu A&B, Covid) Nasopharyngeal Swab     Status: None   Collection Time: 08/10/21  9:24 AM   Specimen: Nasopharyngeal Swab;  Nasopharyngeal(NP) swabs in vial transport medium  Result Value Ref Range   SARS Coronavirus 2 by RT PCR NEGATIVE NEGATIVE    Comment: (NOTE) SARS-CoV-2 target nucleic acids are NOT DETECTED.  The SARS-CoV-2 RNA is generally detectable in upper respiratory specimens during the acute phase of infection. The lowest concentration of SARS-CoV-2 viral copies this assay can detect is 138 copies/mL. A negative result does not preclude SARS-Cov-2 infection and should not be used as the sole basis for treatment or other patient management decisions. A negative result may occur with  improper specimen collection/handling, submission of specimen other than nasopharyngeal swab, presence of viral mutation(s) within the areas targeted by this assay, and inadequate number of viral copies(<138 copies/mL). A negative result must be combined with clinical observations, patient history, and epidemiological information. The expected result is Negative.  Fact Sheet for Patients:  BloggerCourse.com  Fact Sheet for Healthcare Providers:  SeriousBroker.it  This test is no t yet approved or cleared by the [place] FDA and  has been authorized for detection and/or diagnosis of SARS-CoV-2 by FDA under an Emergency Use Authorization (EUA). This EUA will remain  in effect (meaning this test can be used) for the duration of the COVID-19 declaration under  Section 564(b)(1) of the Act, 21 U.S.C.section 360bbb-3(b)(1), unless the authorization is terminated  or revoked sooner.       Influenza A by PCR NEGATIVE NEGATIVE   Influenza B by PCR NEGATIVE NEGATIVE    Comment: (NOTE) The Xpert Xpress SARS-CoV-2/FLU/RSV plus assay is intended as an aid in the diagnosis of influenza from Nasopharyngeal swab specimens and should not be used as a sole basis for treatment. Nasal washings and aspirates are unacceptable for Xpert Xpress  SARS-CoV-2/FLU/RSV testing.  Fact Sheet for Patients: BloggerCourse.com  Fact Sheet for Healthcare Providers: SeriousBroker.it  This test is not yet approved or cleared by the [place] FDA and has been authorized for detection and/or diagnosis of SARS-CoV-2 by FDA under an Emergency Use Authorization (EUA). This EUA will remain in effect (meaning this test can be used) for the duration of the COVID-19 declaration under Section 564(b)(1) of the Act, 21 U.S.C. section 360bbb-3(b)(1), unless the authorization is terminated or revoked.  Performed at [facility], [address] [place], [place] [address]   ABO/Rh     Status: None   Collection Time: 08/10/21 10:27 AM  Result Value Ref Range   ABO/RH(D)      O POS Performed at [facility], [address] [place], [place] [address]   Varicella zoster antibody, IgG     Status: None   Collection Time: 08/10/21 11:54 AM  Result Value Ref Range   Varicella IgG 188 Immune >165 index    Comment: (NOTE)                               Negative          <135                               Equivocal    135 - 165                               Positive          >165 A positive result generally indicates exposure to the pathogen or administration of specific immunoglobulins, but it is not indication of active infection or stage of disease. Performed At: [facility] [address] [place], [place] [id number] Jolene Schimke MD TK:[id number]   Measles/Mumps/Rubella Immunity     Status: Abnormal   Collection Time: 08/10/21 11:54 AM  Result Value Ref Range   Rubella 8.91 Immune >0.99 index    Comment: (NOTE)                                Non-immune       <0.90                                Equivocal  0.90 - 0.99                                Immune           >0.99    Rubeola IgG <13.5 (L) Immune >16.4 AU/mL    Comment: (NOTE)  Negative        <13.5                                 Equivocal 13.5 - 16.4                                 Positive        >16.4 Presence of antibodies to Rubeola is presumptive evidence of immunity except when acute infection is suspected.    Mumps IgG 118.0 Immune >10.9 AU/mL    Comment: (NOTE)                                Negative         <9.0                                Equivocal  9.0 - 10.9                                Positive        >10.9 A positive result generally indicates past exposure to Mumps virus or previous vaccination. Performed At: [facility] [address] [place], [place] [id number] Jolene Schimke MD WU:[id number]   CBC     Status: Abnormal   Collection Time: 08/11/21  5:46 AM  Result Value Ref Range   WBC 13.4 (H) 4.0 - 10.5 K/uL   RBC 3.97 3.87 - 5.11 MIL/uL   Hemoglobin 12.1 12.0 - Selena.0 g/dL   HCT 95.6 (L) 21.3 - 08.6 %   MCV 88.7 80.0 - 100.0 fL   MCH 30.5 26.0 - 34.0 pg   MCHC 34.4 30.0 - 36.0 g/dL   RDW 57.8 46.9 - 62.9 %   Platelets 214 150 - 400 K/uL   nRBC 0.0 0.0 - 0.2 %    Comment: Performed at [facility], [address], [place], [place] [address]    Assessment:   20 y.o. G1P1001 postpartum day # 1, lactating  Plan:    1) Acute blood loss anemia - hemodynamically stable and asymptomatic - po ferrous sulfate  2) Blood Type --/--/O POS Performed at [facility], [address] [place], [place] [address]  (10/07 1027) / Rubella 8.91 (10/07 1154) / Varicella: Varivax x 2 doses  3) TDAP status up to date  4) Feeding plan  breast and formula  5)  Education given regarding options for contraception, as well as compatibility with breast feeding if applicable.  Patient plans on Nexplanon for contraception.  6) Disposition: continue current care   Tresea Mall, CNM [facility] 08/12/2021, 11:28 AM

## 2021-08-12 NOTE — Progress Notes (Signed)
CSW spoke with patient and used the interpreter services. Patient wanted to know information on how to apply for medicaid. Patient stated she tried before the baby was born and they told her it was not an emergency. CSW stated she would have to go through social services and apply again due to the baby being born. CSW also told patient that there might be a Artist here tomorrow that could also assist her.

## 2021-08-13 MED ORDER — IBUPROFEN 600 MG PO TABS: 600.0000 mg | ORAL_TABLET | Freq: Four times a day (QID) | ORAL | 0 refills | Status: AC

## 2021-08-13 NOTE — Lactation Note (Signed)
This note was copied from a baby's chart. Lactation Consultation Note  Patient Name: Selena Chavez [id number] Date: 08/13/2021 Reason for consult: Follow-up assessment;Primapara;Term Age:19 hours  LC called into room by RN to assist with breastfeeding. Mom is emotional, feeling that she is not doing/providing enough for her baby.  Mom agrees to allow lactation to support her during a feeding. Mom was encouraged to get into a comfortable position, pillows placed for support in football hold. Baby was able to grasp the R breast without the nipple shield, with a few audible swallows but did not stay for long before falling asleep (6 minutes). LC and parents discussed the impact that the artifical nipple has had on the sensation inside his mouth and how this can affect breastfeeding. Mom has been encouraged to try all feedings at the breast without the shield first working with a deep sandwich hold, mom verbalizes understanding. LC compared the shape of the nipple shield with the bottle nipple and discussed how the nipple shield can be helpful if baby is struggling to maintain latch.  Baby woke back up, cuing, and was placed at the L breast with the nipple shield where he grasped easily, strong rhythmic sucking pattern with spontaneous audible swallows. Baby remained active at the breast for a minimum of 10 minutes, and feel asleep. Mom felt encouraged after this feeding.  Encouraged to continue to offer breast at every feeding, both sides if needed, and signs that maybe a supplement may still be necessary.   Maternal Data Has patient been taught Hand Expression?: Yes Does the patient have breastfeeding experience prior to this delivery?: No  Feeding Mother's Current Feeding Choice: Breast Milk  LATCH Score Latch: Grasps breast easily, tongue down, lips flanged, rhythmical sucking. (w/ nipple shield)  Audible Swallowing: Spontaneous and intermittent  Type of Nipple: Flat  Comfort  (Breast/Nipple): Soft / non-tender  Hold (Positioning): Assistance needed to correctly position infant at breast and maintain latch.  LATCH Score: 8   Lactation Tools Discussed/Used Tools: Nipple Shields;Coconut oil;Comfort gels Nipple shield size: 20  Interventions Interventions: Assisted with latch;Hand express;Adjust position;Support pillows;Position options;Breast compression;Coconut oil;Comfort gels;Education  Discharge Pump: Manual  Consult Status Consult Status: Complete    Selena Chavez 08/13/2021, 4:10 PM

## 2021-08-13 NOTE — Lactation Note (Signed)
This note was copied from a baby's chart. Lactation Consultation Note  Patient Name: Selena Chavez [id number] Date: 08/13/2021 Reason for consult: Initial assessment;Primapara;Term Age:19 hours  Interpreter [phone] Initial lactation visit. Mom is P1, delivered SVD 58hrs ago.  Mom and baby are learning to breastfeed well. Mom has had support for RN's over the weekend and feels that football hold is the most successful. She has started to provide formula supplement if she feels baby is not full- but not after every feeding. LC discussed the importance of baby at the breast on demand, the importance of the milk removal and stimulation for building an adequate supply. Reviewed signs that baby is full after breastfeeding, and tips for keeping him awake at the breast for entire feeding. LC educated on newborn stomach size, feeding patterns and behaviors, growth spurts and cluster feeding, output expectations, and again signs of contentment at the breast.   LC reviewed with mom anticipated breast changes, management of breast fullness and engorgement and nipple care. Encouraged on demand feedings, limiting of formula/artificial nipples if possible. Information for outpatient [facility] services and community support given. Encouraged to call with questions and for ongoing BF support.   Maternal Data Has patient been taught Hand Expression?: Yes Does the patient have breastfeeding experience prior to this delivery?: No  Feeding Mother's Current Feeding Choice: Breast Milk  LATCH Score                    Lactation Tools Discussed/Used Tools: Nipple Dorris Carnes (doesn't use every time)  Interventions Interventions: Breast feeding basics reviewed;Hand express;Pre-pump if needed;Education  Discharge Discharge Education: Engorgement and breast care;Warning signs for feeding baby;Outpatient recommendation Pump: Manual  Consult Status Consult Status: Complete    Danford Bad 08/13/2021, 10:28 AM

## 2021-08-13 NOTE — Progress Notes (Signed)
Patient discharged with infant. Discharge instructions, prescriptions, and follow up appointments given to and reviewed with patient with the use of an interpreter. Patient verbalized understanding. Escorted out by [employer].

## 2021-09-24 ENCOUNTER — Ambulatory Visit: Payer: Self-pay

## 2021-09-25 ENCOUNTER — Ambulatory Visit: Payer: Self-pay | Admitting: Family Medicine

## 2021-09-25 ENCOUNTER — Other Ambulatory Visit: Payer: Self-pay

## 2021-09-25 ENCOUNTER — Encounter: Payer: Self-pay | Admitting: Family Medicine

## 2021-09-25 DIAGNOSIS — Z3009 Encounter for other general counseling and advice on contraception: Secondary | ICD-10-CM

## 2021-09-25 DIAGNOSIS — Z30017 Encounter for initial prescription of implantable subdermal contraceptive: Secondary | ICD-10-CM

## 2021-09-25 LAB — HEMOGLOBIN, FINGERSTICK: Hemoglobin: 13.1 g/dL

## 2021-09-25 MED ORDER — ETONOGESTREL 68 MG ~~LOC~~ IMPL
68.0000 mg | DRUG_IMPLANT | Freq: Once | SUBCUTANEOUS | Status: AC
  Administered 2021-09-25: 68 mg via SUBCUTANEOUS

## 2021-09-25 NOTE — Progress Notes (Signed)
Post Partum Exam  Selena Chavez is a 20 y.o. G76P1001 female who presents for a postpartum visit. She is 6  w3d  postpartum following a spontaneous vaginal delivery. I have fully reviewed the prenatal and intrapartum course. The delivery was at [redacted]W[redacted]d gestational weeks.  Anesthesia: epidural. Postpartum course has been had an adjustment period. . Baby's course has been adjusting well . Baby is feeding by breast and formula.  Bleeding no bleeding. Bowel function is normal. Bladder function is normal. Patient is not sexually active. Contraception method is none.   Postpartum depression screening:  Edinburgh Postnatal Depression Scale - 09/25/21 1530       Edinburgh Postnatal Depression Scale:  In the Past 7 Days   I have been able to laugh and see the funny side of things. 0    I have looked forward with enjoyment to things. 0    I have blamed myself unnecessarily when things went wrong. 1    I have been anxious or worried for no good reason. 0    I have felt scared or panicky for no good reason. 0    Things have been getting on top of me. 0    I have been so unhappy that I have had difficulty sleeping. 0    I have felt sad or miserable. 0    I have been so unhappy that I have been crying. 0    The thought of harming myself has occurred to me. 0    Edinburgh Postnatal Depression Scale Total 1              The following portions of the patient's history were reviewed and updated as appropriate: allergies, current medications, past family history, past medical history, past social history, past surgical history, and problem list. Last pap smear  not indicated   Review of Systems Pertinent items are noted in HPI.    Objective:  BP 116/72   Ht 5\' 3"  (1.6 m)   Wt 188 lb 3.2 oz (85.4 kg)   LMP 09/11/2021 (Approximate)   Breastfeeding Yes   BMI 33.34 kg/m   Gen: well appearing, NAD HEENT: no scleral icterus CV: RR Lung: Normal WOB Breast:performed-yes  Ext: warm well  perfused  GU: pt declined vaginal exam  Rectal: performed -  not indicated       Assessment:     postpartum exam. Pap smear not done at today's visit.   Plan:   Essential components of care per ACOG recommendations for Comprehensive Postpartum exam:  1.  Mood and well being: Patient with negative depression screening today. Reviewed local resources for support. EPDS is low risk. Reviewed resources and that mood sx in first year after pregnancy are considered related to pregnancy and to reach out for help at [facility] if needed. Discussed [facility] as link to care and availability of LCSW for counseling  - Patient does not use tobacco.  - hx of drug use? No     2. Infant care and feeding:  -Patient currently breastmilk feeding? Yes If breastmilk feeding discussed return to work and pumping. If needed, patient was provided letter for work to allow for every 2-3 hr pumping breaks, and to be granted a private location to express breastmilk and refrigerated area to store breastmilk. Reviewed importance of draining breast regularly to support lactation.   -Recommended patient engage with WIC/BFpeer counselors  -Counseled to sign new child up for [facility] services -Social determinants of health (SDOH) reviewed in EPIC.  No concerns. No needs were identified.   3. Sexuality, contraception and birth spacing  Contraception: Contraception counseling: Reviewed all forms of birth control options in the tiered based approach. available including abstinence; over the counter/barrier methods; hormonal contraceptive medication including pill, patch, ring, injection,contraceptive implant; hormonal and nonhormonal IUDs; permanent sterilization options including vasectomy and the various tubal sterilization modalities. Risks, benefits, and typical effectiveness rates were reviewed.  Questions were answered.  Written information was also given to the patient to review.  Patient desires nexplanon , this was prescribed for  patient. She will follow up in  as  needed  for surveillance.  She was told to call with any further questions, or with any concerns about this method of contraception.  Emphasized use of condoms 100% of the time for STI prevention.  Patient was offered ECP. ECP was not accepted by the patient. ECP counseling was not given - see RN documentation  - Patient does not want a pregnancy in the next year.  Desired family size is 2 children.  - Reviewed forms of contraception in tiered fashion. Patient desired  nexplanon   today.   - Discussed birth spacing of 18 months  Nexplanon Insertion Procedure Patient identified, informed consent performed, consent signed.   Patient does understand that irregular bleeding is a very common side effect of this medication. She was advised to have backup contraception after placement. Patient was determined to meet WHO criteria for not being pregnant. Appropriate time out taken.  The insertion site was identified 8-10 cm (3-4 inches) from the medial epicondyle of the humerus and 3-5 cm (1.25-2 inches) posterior to (below) the sulcus (groove) between the biceps and triceps muscles of the patient's right  arm and marked.  Patient was prepped with alcohol swab and then injected with 3 ml of 1% lidocaine.  Arm was prepped with chlorhexidene, Nexplanon removed from packaging,  Device confirmed in needle, then inserted full length of needle and withdrawn per handbook instructions. Nexplanon was able to palpated in the patient's arm; patient palpated the insert herself. There was minimal blood loss.  Patient insertion site covered with guaze and a pressure bandage to reduce any bruising.  The patient tolerated the procedure well and was given post procedure instructions. Nexplanon:   Counseled patient to take OTC analgesic starting as soon as lidocaine starts to wear off and take regularly for at least 48 hr to decrease discomfort.  Specifically to take with food or milk to decrease  stomach upset and for IB 600 mg (3 tablets) every 6 hrs; IB 800 mg (4 tablets) every 8 hrs; or Aleve 2 tablets every 12 hrs.  4. Sleep and fatigue -Encouraged family/partner/community support of 4 hrs of uninterrupted sleep to help with mood and fatigue  5. Physical Recovery  - Discussed patients delivery and complications - Patient had a 1st  degree laceration, perineal healing reviewed. Patient expressed understanding - Patient has urinary incontinence? No  - Patient is safe to resume physical and sexual activity  6.  Health Maintenance/Chronic Disease Health Maintenance Due  Topic Date Due   HIV Screening  Never done   HPV VACCINES (2 - 3-dose series) 01/31/2020   COVID-19 Vaccine (4 - Booster for Pfizer series) 02/27/2021    - No pap smear performed d/t pt age.   There are no diagnoses linked to this encounter.  Patient given handout about PCP care in the community Given MVI per family planning program guidelines and availability  Follow up in: 1  year  or as needed.   Language line [place] [phone])  used for Spanish interpretation.     Wendi Snipes, FNP

## 2021-09-25 NOTE — Progress Notes (Signed)
Patient here for PP exam. SVD on 08/11/2021. Currently breastfeeding. Last sex was before birth of baby. Desires Nexplanon for BCM. Needs Hgb check today.Burt Knack, RN

## 2022-08-18 IMAGING — US US OB COMP +14 WK
1 series · 13 of 28 positions shown · non-contrast
Comparison: none

CLINICAL DATA: Scan for anatomy. 19-year-old gravida 1 para 0 at 20
weeks 3 days by EDC of 07/28/2021. LMP was 10/21/2020.

EXAM:
OBSTETRICAL ULTRASOUND >14 WKS

[Series 1: us ob comp + 14 wk · 13 of 93 slices shown]
[im 4/93]
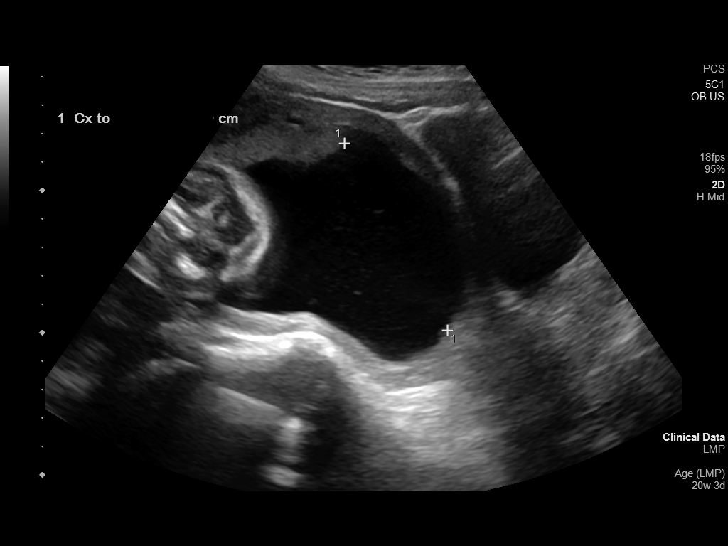
[im 11/93]
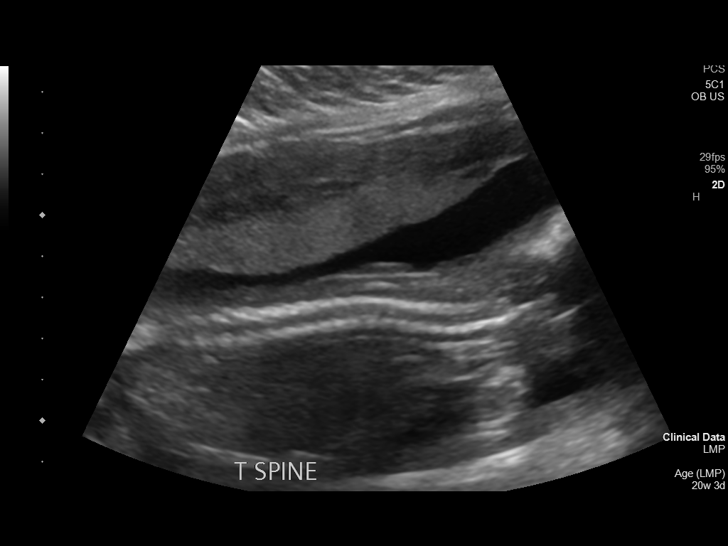
[im 18/93]
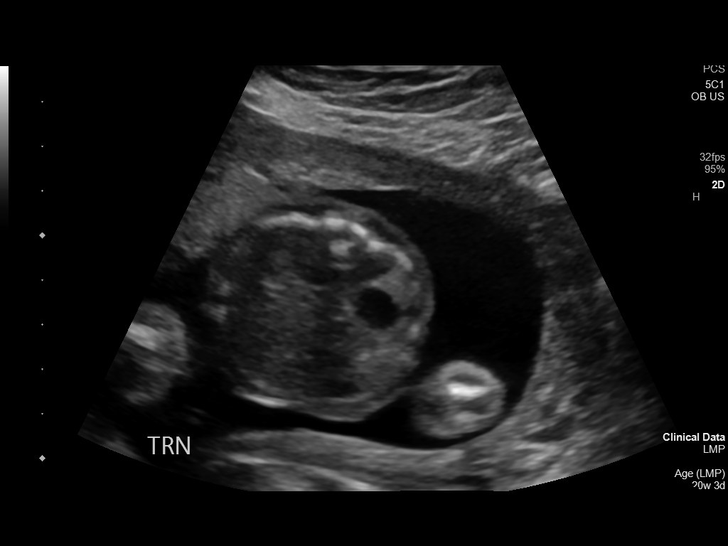
[im 24/93]
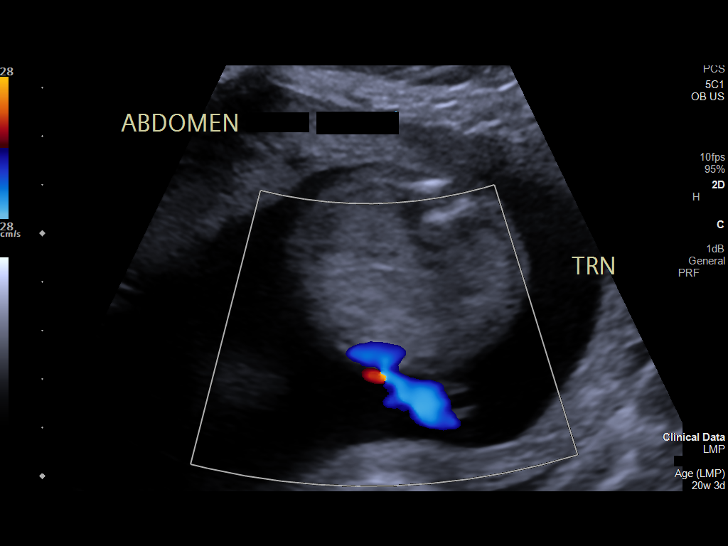
[im 31/93]
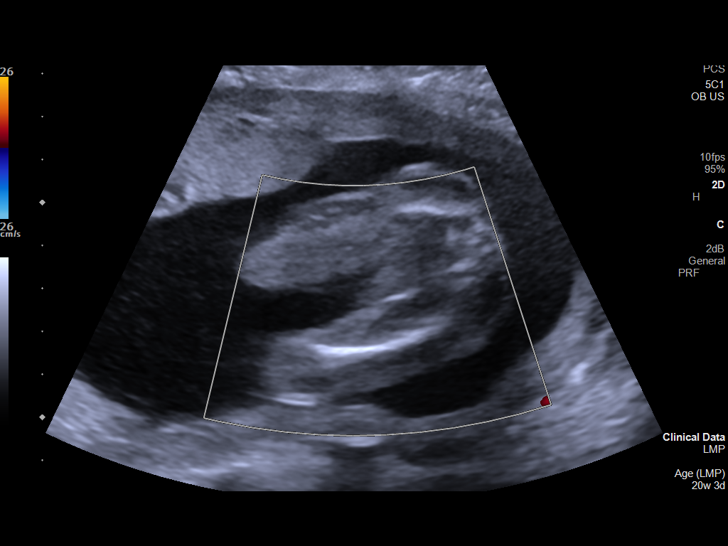
[im 38/93]
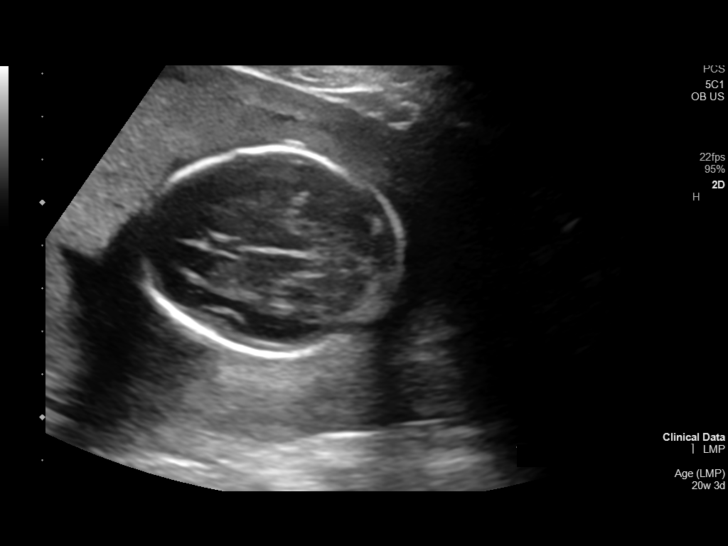
[im 48/93]
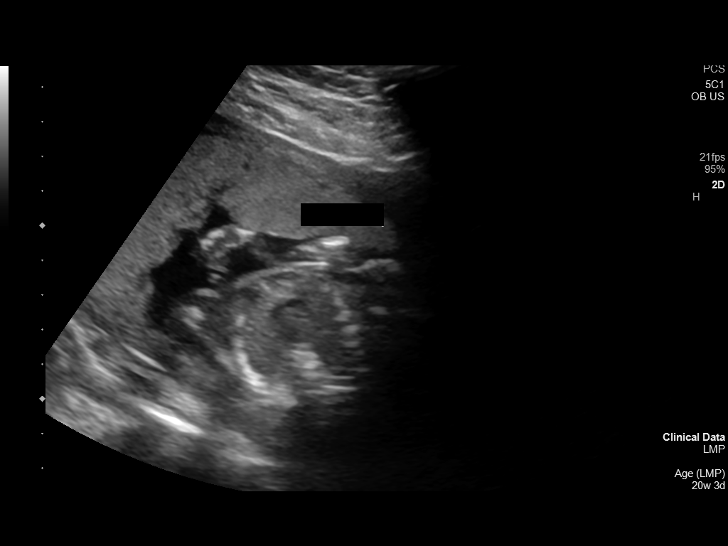
[im 55/93]
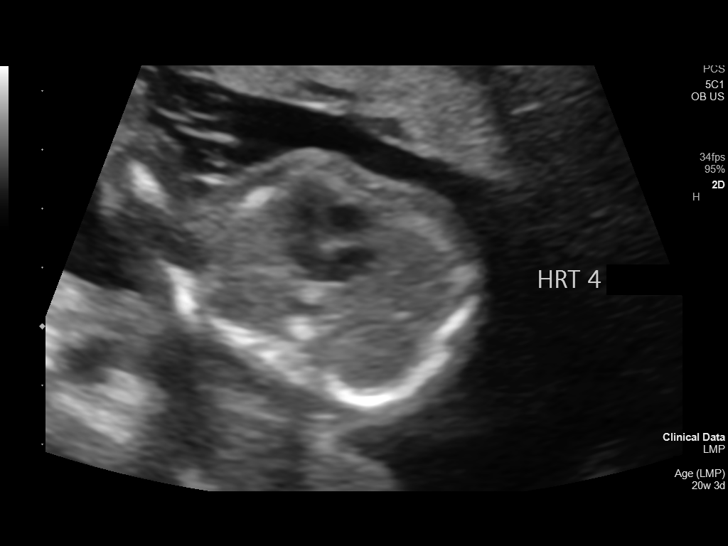
[im 62/93]
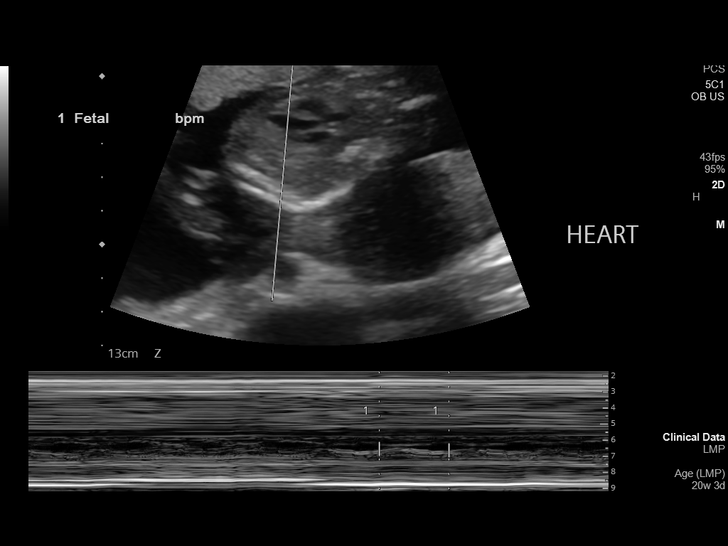
[im 69/93]
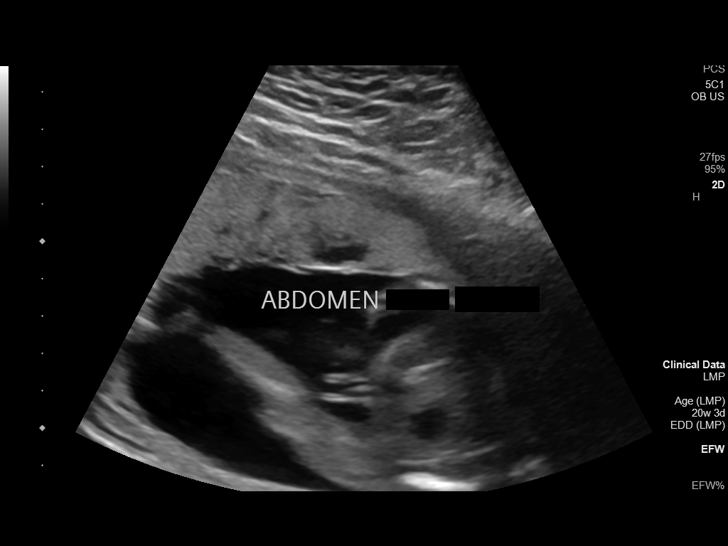
[im 75/93]
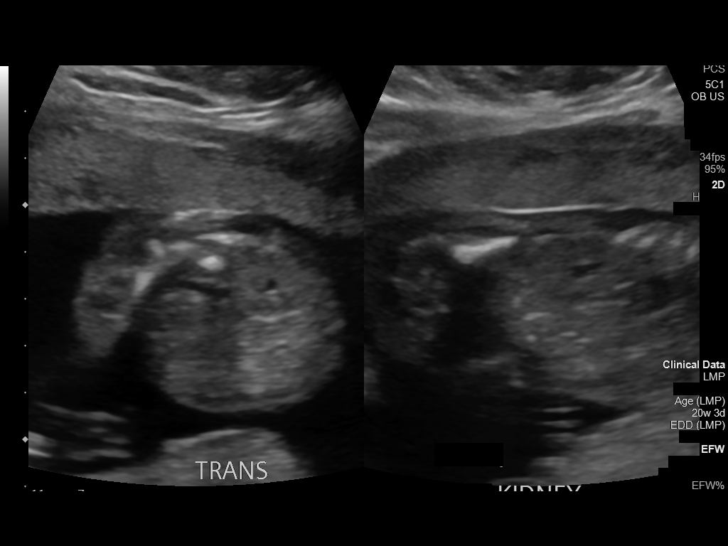
[im 82/93]
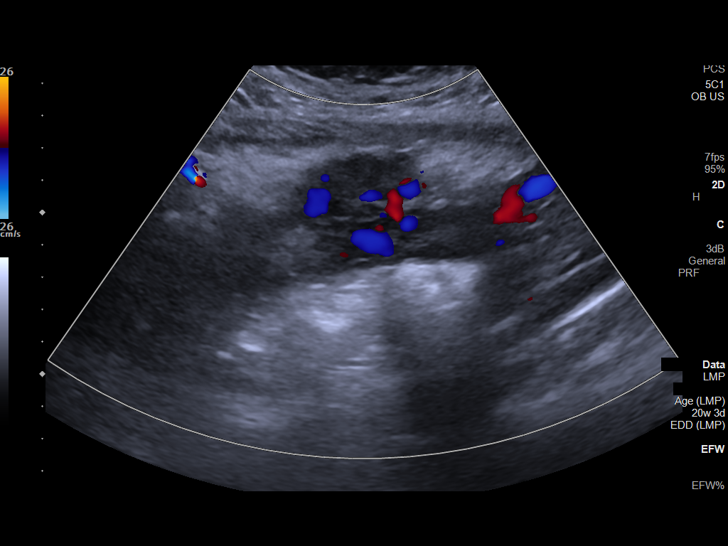
[im 89/93]
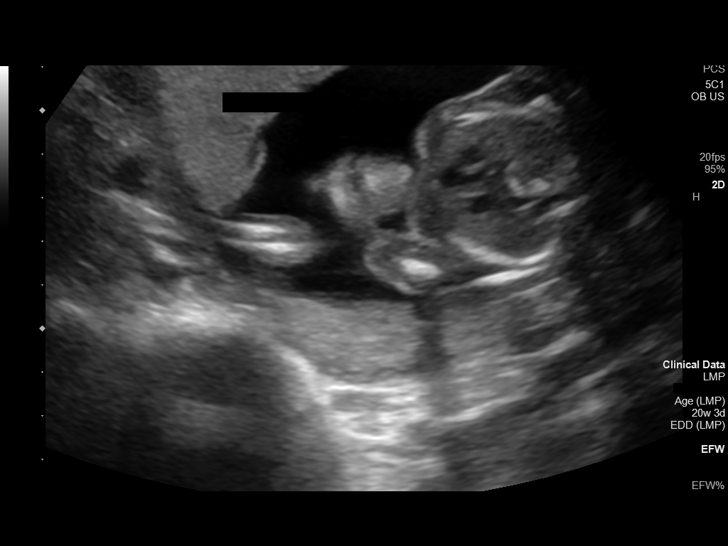

[13 of 28 positions shown; findings below may reference images not displayed]

FINDINGS: Number of Fetuses: 1

Heart Rate:  147 bpm

Movement: Present

Presentation: Cephalic

Previa: None

Placental Location: Anterolateral to the RIGHT

Amniotic Fluid (Subjective): Normal

Amniotic Fluid (Objective):

Vertical pocket = 7.0cm

FETAL BIOMETRY

BPD: 4.8cm 20w 4d

HC:   17.6cm 20w 1d

AC:   15.4cm 20w 4d

FL:   3.6cm 21w 2d

Current Mean GA: 20w 5d US EDC: 07/26/2021

Assigned GA:  20w 3d Assigned EDC: 07/28/2021

FETAL ANATOMY

Lateral Ventricles: Appears normal

Thalami/CSP: Appears normal

Posterior Fossa:  Appears normal

Nuchal Region: Appears normal   NFT= 3.5 millimeters

Upper Lip: Appears normal

Spine: Appears normal

4 Chamber Heart on Left: Appears normal

LVOT: Appears normal

RVOT: Appears normal

Stomach on Left: Appears normal

3 Vessel Cord: Appears normal

Cord Insertion site: Appears normal

Kidneys: Appears normal

Bladder: Appears normal

Extremities: Appears normal

Sex: Male

Technically difficult due to: Fetal position

Maternal Findings:

Cervix:  4.1 centimeters on transabdominal evaluation.
IMPRESSION: 1. Single living intrauterine fetus in cephalic presentation.
2. Size and dates correlate within 2 days.
3. No fetal anomalies identified.

## 2022-10-07 ENCOUNTER — Encounter: Payer: Self-pay | Admitting: Family Medicine

## 2022-10-07 ENCOUNTER — Ambulatory Visit: Payer: Self-pay

## 2022-10-07 ENCOUNTER — Ambulatory Visit (LOCAL_COMMUNITY_HEALTH_CENTER): Payer: Self-pay | Admitting: Family Medicine

## 2022-10-07 VITALS — BP 141/65 | Ht 63.0 in | Wt 209.2 lb

## 2022-10-07 DIAGNOSIS — Z113 Encounter for screening for infections with a predominantly sexual mode of transmission: Secondary | ICD-10-CM

## 2022-10-07 DIAGNOSIS — Z3009 Encounter for other general counseling and advice on contraception: Secondary | ICD-10-CM

## 2022-10-07 DIAGNOSIS — Z01419 Encounter for gynecological examination (general) (routine) without abnormal findings: Secondary | ICD-10-CM

## 2022-10-07 LAB — WET PREP FOR TRICH, YEAST, CLUE
Trichomonas Exam: NEGATIVE
Yeast Exam: NEGATIVE

## 2022-10-07 LAB — HM HIV SCREENING LAB: HM HIV Screening: NEGATIVE

## 2022-10-07 NOTE — Progress Notes (Signed)
[facility] DEPARTMENT [facility] [address] Main Number: [phone]  Family Planning Visit- Repeat Yearly Visit  Subjective:  Selena Chavez is a 21 y.o. G1P1001  being seen today for an annual wellness visit and to discuss contraception options.   The patient is currently using Hormonal Implant for pregnancy prevention. Patient does not want a pregnancy in the next year.   she/her/hers report they are looking for a method that provides High efficacy at preventing pregnancy   Patient has the following medical problems: does not have any active problems on file.    Patient reports to clinic for physical. States she got a nexplanon last year, no concerns with this. Pt c/o "lumps" in her breast, she's is currently breastfeeding and states that she thinks it is related to this.    See flowsheet for other program required questions.   There is no height or weight on file to calculate BMI. - Patient is eligible for diabetes screening based on BMI and age >65?  not applicable HA1C ordered? not applicable  Patient reports 1 of partners in last year. Desires STI screening?  Yes   Has patient been screened once for HCV in the past?  No  No results found for: "HCVAB"  Does the patient have current of drug use, have a partner with drug use, and/or has been incarcerated since last result? No  If yes-- Screen for HCV through [facility]   Does the patient meet criteria for HBV testing? No  Criteria:  -Household, sexual or needle sharing contact with HBV -History of drug use -HIV positive -Those with known Hep C   Health Maintenance Due  Topic Date Due   HIV Screening  Never done   HPV VACCINES (2 - 3-dose series) 01/31/2020   INFLUENZA VACCINE  06/04/2022   COVID-19 Vaccine (4 - 2023-24 season) 07/05/2022   CHLAMYDIA SCREENING  07/11/2022    Review of Systems  Constitutional: Negative.   Cardiovascular: Negative.    Gastrointestinal: Negative.   Genitourinary: Negative.   Skin: Negative.   Neurological: Negative.   Psychiatric/Behavioral: Negative.      The following portions of the patient's history were reviewed and updated as appropriate: allergies, current medications, past family history, past medical history, past social history, past surgical history and problem list. Problem list updated.  Objective:  There were no vitals filed for this visit.  Physical Exam Vitals and nursing note reviewed.  Constitutional:      Appearance: Normal appearance.  HENT:     Head: Normocephalic and atraumatic.     Mouth/Throat:     Mouth: Mucous membranes are moist.     Pharynx: Oropharynx is clear. No oropharyngeal exudate or posterior oropharyngeal erythema.  Pulmonary:     Effort: Pulmonary effort is normal.  Chest:  Breasts:    Tanner Score is 5.     Right: Normal. No mass, nipple discharge or tenderness.     Left: Normal. No mass, nipple discharge or tenderness.  Abdominal:     General: Abdomen is flat.     Palpations: There is no mass.     Tenderness: There is no abdominal tenderness. There is no rebound.  Genitourinary:    General: Normal vulva.     Exam position: Lithotomy position.     Pubic Area: No rash or pubic lice.      Labia:        Right: No rash or lesion.  Left: No rash or lesion.      Vagina: Normal. No vaginal discharge, erythema, bleeding or lesions.     Cervix: No cervical motion tenderness, discharge, friability, lesion or erythema.     Uterus: Normal.      Adnexa: Right adnexa normal and left adnexa normal.     Rectum: Normal.     Comments: pH = 4 Lymphadenopathy:     Head:     Right side of head: No preauricular or posterior auricular adenopathy.     Left side of head: No preauricular or posterior auricular adenopathy.     Cervical: No cervical adenopathy.     Upper Body:     Right upper body: No supraclavicular, axillary or epitrochlear adenopathy.      Left upper body: No supraclavicular, axillary or epitrochlear adenopathy.     Lower Body: No right inguinal adenopathy. No left inguinal adenopathy.  Skin:    General: Skin is warm and dry.     Findings: No rash.  Neurological:     Mental Status: She is alert and oriented to person, place, and time.       Assessment and Plan:  Selena Chavez is a 21 y.o. female G1P1001 presenting to the [facility] Department for an yearly wellness and contraception visit   Contraception counseling: Reviewed options based on patient desire and reproductive life plan. Patient is interested in Hormonal Implant.   Risks, benefits, and typical effectiveness rates were reviewed.  Questions were answered.  Written information was also given to the patient to review.    The patient will follow up in  1 years for surveillance.  The patient was told to call with any further questions, or with any concerns about this method of contraception.  Emphasized use of condoms 100% of the time for STI prevention.  Patient was assessed for need for ECP.  Not indicated today. Patient has Nexplanon.   1. Well woman exam with routine gynecological exam 21yo F- PAP to start at age 71 (08/10/2023). -CBE today- patient c/o some "lumps" states that she believes these are related to her breastfeeding. No abnormalities noted. Discussed with patient that milk glands and ducts may be more prominent during breast feeding.   BP elevated today- 141/65. Pt states she had one other high BP during her pregnancy but otherwise appears to have normal BP's per chart review. Pt has PCP. Monitor at next visit.    2. Screening for venereal disease Patient accepted all screenings including  vaginal CT/GC and bloodwork for HIV/RPR.  Patient meets criteria for HepB screening? No. Ordered? No - not indicated Patient meets criteria for HepC screening? No. Ordered? No - not indicated  Treat wet prep per standing order Discussed  time line for State Lab results and that patient will be called with positive results and encouraged patient to call if she had not heard in 2 weeks.  Counseled to return or seek care for continued or worsening symptoms Recommended condom use with all sex  Patient is currently using *Nexplanon to prevent pregnancy.     RTC with concerns.   Total time spent 30 minutes.   Lenice Llamas, [place]

## 2022-10-07 NOTE — Progress Notes (Signed)
Pt here for annual physical examination and Nexplanon check.  Wet mount results reviewed with patient.  No treatment needed at this time. Family planning packet provided.-Collins Scotland, RN

## 2023-07-03 ENCOUNTER — Emergency Department
Admission: EM | Admit: 2023-07-03 | Discharge: 2023-07-03 | Disposition: A | Discharge status: Home / Self Care | Payer: Self-pay | Attending: Emergency Medicine | Admitting: Emergency Medicine

## 2023-07-03 ENCOUNTER — Emergency Department: Payer: Self-pay

## 2023-07-03 ENCOUNTER — Other Ambulatory Visit: Payer: Self-pay

## 2023-07-03 DIAGNOSIS — N83201 Unspecified ovarian cyst, right side: Secondary | ICD-10-CM | POA: Insufficient documentation

## 2023-07-03 LAB — COMPREHENSIVE METABOLIC PANEL
ALT: 21 U/L (ref 0–44)
AST: 28 U/L (ref 15–41)
Albumin: 4.5 g/dL (ref 3.5–5.0)
Alkaline Phosphatase: 94 U/L (ref 38–126)
Anion gap: 12 (ref 5–15)
BUN: 15 mg/dL (ref 6–20)
CO2: 23 mmol/L (ref 22–32)
Calcium: 9.7 mg/dL (ref 8.9–10.3)
Chloride: 101 mmol/L (ref 98–111)
Creatinine, Ser: 0.63 mg/dL (ref 0.44–1.00)
GFR, Estimated: >60 mL/min (ref >60)
Glucose, Bld: 146 mg/dL — ABNORMAL HIGH (ref 70–99)
Potassium: 3.7 mmol/L (ref 3.5–5.1)
Sodium: 136 mmol/L (ref 135–145)
Total Bilirubin: 0.5 mg/dL (ref 0.3–1.2)
Total Protein: 8.5 g/dL — ABNORMAL HIGH (ref 6.5–8.1)

## 2023-07-03 LAB — URINALYSIS, ROUTINE W REFLEX MICROSCOPIC
Bacteria, UA: NONE SEEN
Bilirubin Urine: NEGATIVE
Glucose, UA: NEGATIVE mg/dL
Ketones, ur: NEGATIVE mg/dL
Leukocytes,Ua: NEGATIVE
Nitrite: NEGATIVE
Protein, ur: NEGATIVE mg/dL
Specific Gravity, Urine: 1.009 (ref 1.005–1.030)
pH: 5 (ref 5.0–8.0)

## 2023-07-03 LAB — CBC
HCT: 42.3 % (ref 36.0–46.0)
Hemoglobin: 14.5 g/dL (ref 12.0–15.0)
MCH: 29.6 pg (ref 26.0–34.0)
MCHC: 34.3 g/dL (ref 30.0–36.0)
MCV: 86.3 fL (ref 80.0–100.0)
Platelets: 318 10*3/uL (ref 150–400)
RBC: 4.9 MIL/uL (ref 3.87–5.11)
RDW: 12.7 % (ref 11.5–15.5)
WBC: 11 10*3/uL — ABNORMAL HIGH (ref 4.0–10.5)
nRBC: 0 % (ref 0.0–0.2)

## 2023-07-03 LAB — LIPASE, BLOOD: Lipase: 31 U/L (ref 11–51)

## 2023-07-03 LAB — POC URINE PREG, ED: Preg Test, Ur: NEGATIVE

## 2023-07-03 MED ORDER — HYDROCODONE-ACETAMINOPHEN 5-325 MG PO TABS: 1.0000 | ORAL_TABLET | ORAL | 0 refills | Status: AC | PRN

## 2023-07-03 MED ORDER — MELOXICAM 7.5 MG PO TABS
15.0000 mg | ORAL_TABLET | Freq: Once | ORAL | Status: AC
  Administered 2023-07-03: 15 mg via ORAL
  Filled 2023-07-03: qty 2

## 2023-07-03 MED ORDER — MELOXICAM 15 MG PO TABS: 15.0000 mg | ORAL_TABLET | Freq: Every day | ORAL | 0 refills | Status: AC

## 2023-07-03 MED ORDER — HYDROCODONE-ACETAMINOPHEN 5-325 MG PO TABS
1.0000 | ORAL_TABLET | Freq: Once | ORAL | Status: AC
  Administered 2023-07-03: 1 via ORAL
  Filled 2023-07-03: qty 1

## 2023-07-03 NOTE — ED Notes (Signed)
Patient returned from CT. Stretcher locked in lowest position, with call bell in reach. Pt resting.

## 2023-07-03 NOTE — ED Notes (Signed)
Pt also states pain and burning with urination. Pt became diaphoretic while in triage.

## 2023-07-03 NOTE — ED Triage Notes (Addendum)
Pt comes with c/o lower back pain and belly pain for few days. Pt states no recent injuries. Pt states pain radiates around to belly.  Pt denies any vomiting.

## 2023-07-03 NOTE — ED Notes (Signed)
CT at bedside to explain procedure with use of interpreter.

## 2023-07-03 NOTE — ED Notes (Signed)
EKG performed and showed critical result. EKG taken to MD and MD stated ok it looks fine. RN Sam informed and pt sent back out to lobby.

## 2023-07-03 NOTE — ED Notes (Signed)
See triage note  Presents with flank pain and some painful urination  Had fever yesterday of 100  Afebrile on arrival

## 2023-07-03 NOTE — ED Notes (Signed)
Patient transported to CT 

## 2023-07-03 NOTE — ED Provider Notes (Signed)
[facility] Provider Note  Patient Contact: 6:31 PM (approximate)   History   Abdominal Pain and Back Pain   HPI  Selena Chavez is a 22 y.o. female who presents the emergency department complaining of lower abdominal pain radiating into the back.  Patient states that it is equally painful to both sides of the lower abdomen/pelvic region.  She did have some discomfort with urination.  Patient denies any vaginal bleeding or discharge.  No concerns for STD.  Patient denies any chance of pregnancy.  Patient has no emesis, diarrhea or constipation.  No history of same in the past.   Physical Exam   Triage Vital Signs: ED Triage Vitals  Encounter Vitals Group     BP 07/03/23 1755 (!) 139/94     Systolic BP Percentile --      Diastolic BP Percentile --      Pulse Rate 07/03/23 1755 (!) 125     Resp 07/03/23 1755 18     Temp 07/03/23 1755 98 F (36.7 C)     Temp src --      SpO2 07/03/23 1755 100 %     Weight 07/03/23 1821 209 lb 3.5 oz (94.9 kg)     Height 07/03/23 1821 5\' 3"  (1.6 m)     Head Circumference --      Peak Flow --      Pain Score 07/03/23 1754 5     Pain Loc --      Pain Education --      Exclude from Growth Chart --     Most recent vital signs: Vitals:   07/03/23 1755 07/03/23 2000  BP: (!) 139/94   Pulse: (!) 125 99  Resp: 18 17  Temp: 98 F (36.7 C)   SpO2: 100% 96%     General: Alert and in no acute distress.  Cardiovascular:  Good peripheral perfusion Respiratory: Normal respiratory effort without tachypnea or retractions. Lungs CTAB. Good air entry to the bases with no decreased or absent breath sounds. Gastrointestinal: Bowel sounds 4 quadrants.  Soft to palpation but tender in both lower quadrants.  There is no palpable abnormality.. No guarding or rigidity. No palpable masses. No distention. No CVA tenderness. Musculoskeletal: Full range of motion to all extremities.  Neurologic:  No gross focal neurologic  deficits are appreciated.  Skin:   No rash noted Other:   ED Results / Procedures / Treatments   Labs (all labs ordered are listed, but only abnormal results are displayed) Labs Reviewed  COMPREHENSIVE METABOLIC PANEL - Abnormal; Notable for the following components:      Result Value   Glucose, Bld 146 (*)    Total Protein 8.5 (*)    All other components within normal limits  CBC - Abnormal; Notable for the following components:   WBC 11.0 (*)    All other components within normal limits  URINALYSIS, ROUTINE W REFLEX MICROSCOPIC - Abnormal; Notable for the following components:   Color, Urine STRAW (*)    APPearance CLEAR (*)    Hgb urine dipstick LARGE (*)    All other components within normal limits  LIPASE, BLOOD  POC URINE PREG, ED     EKG     RADIOLOGY  I personally viewed, evaluated, and interpreted these images as part of my medical decision making, as well as reviewing the written report by the radiologist.  ED Provider Interpretation: CT with no evidence of pyelonephritis nephrolithiasis but there is enlargement  of the right uterus.  Recommended ultrasound.  Ultrasound with ovarian cyst without evidence of torsion.  US PELVIC COMPLETE W TRANSVAGINAL AND TORSION R/O  Result Date: 07/03/2023 CLINICAL DATA:  Pelvic pain x6 days. EXAM: TRANSABDOMINAL AND TRANSVAGINAL ULTRASOUND OF PELVIS DOPPLER ULTRASOUND OF OVARIES TECHNIQUE: Both transabdominal and transvaginal ultrasound examinations of the pelvis were performed. Transabdominal technique was performed for global imaging of the pelvis including uterus, ovaries, adnexal regions, and pelvic cul-de-sac. It was necessary to proceed with endovaginal exam following the transabdominal exam to visualize the uterus, endometrium, bilateral ovaries and bilateral adnexa. Color and duplex Doppler ultrasound was utilized to evaluate blood flow to the ovaries. COMPARISON:  None Available. FINDINGS: Uterus Measurements: 8.8 cm x 4.3  cm x 5.7 cm = volume: 113 mL. No fibroids or other mass visualized. Endometrium Thickness: 4.0 mm.  No focal abnormality visualized. Right ovary Measurements: 4.9 cm x 3.1 cm x 4.3 cm = volume: 37 mL. A 3.7 cm x 2.9 cm x 4.0 cm simple cyst is seen within the right ovary. Left ovary Measurements: 3.4 cm x 2.0 cm x 2.3 cm = volume: 8 mL. Normal appearance/no adnexal mass. Pulsed Doppler evaluation of both ovaries demonstrates normal low-resistance arterial and venous waveforms. Other findings No abnormal free fluid. IMPRESSION: Large simple right ovarian cyst. Electronically Signed   By: Aram Candela M.D.   On: 07/03/2023 21:56   CT Renal Stone Study  Result Date: 07/03/2023 CLINICAL DATA:  Abdominal and flank pain with stone suspected. Flank pain radiating to the abdomen. Dysuria and hematuria. Low back pain and belly pain for a few days. EXAM: CT ABDOMEN AND PELVIS WITHOUT CONTRAST TECHNIQUE: Multidetector CT imaging of the abdomen and pelvis was performed following the standard protocol without IV contrast. RADIATION DOSE REDUCTION: This exam was performed according to the departmental dose-optimization program which includes automated exposure control, adjustment of the mA and/or kV according to patient size and/or use of iterative reconstruction technique. COMPARISON:  None Available. FINDINGS: Lower chest: Motion artifact in the lung bases. Small esophageal hiatal hernia. Hepatobiliary: No focal liver abnormality is seen. No gallstones, gallbladder wall thickening, or biliary dilatation. Pancreas: Unremarkable. No pancreatic ductal dilatation or surrounding inflammatory changes. Spleen: Normal in size without focal abnormality. Adrenals/Urinary Tract: Adrenal glands are unremarkable. Kidneys are normal, without renal calculi, focal lesion, or hydronephrosis. Bladder is unremarkable. Stomach/Bowel: Stomach is within normal limits. Appendix appears normal. No evidence of bowel wall thickening, distention,  or inflammatory changes. Vascular/Lymphatic: No significant vascular findings are present. No enlarged abdominal or pelvic lymph nodes. Reproductive: Uterus and left ovary are normal. Right ovary is asymmetrically enlarged measuring 4.1 x 4.6 cm diameter. Indeterminate density. Suggest ultrasound for correlation. Other: No free air or free fluid in the abdomen. Abdominal wall musculature appears intact. Musculoskeletal: No acute or significant osseous findings. IMPRESSION: 1. Nonspecific enlargement of the right uterus. Suggest pelvic ultrasound for further evaluation. 2. No evidence of bowel obstruction or inflammation. Appendix is normal. 3. No renal or ureteral stone or obstruction. Electronically Signed   By: Burman Nieves M.D.   On: 07/03/2023 20:06    PROCEDURES:  Critical Care performed: No  Procedures   MEDICATIONS ORDERED IN ED: Medications  meloxicam (MOBIC) tablet 15 mg (has no administration in time range)  HYDROcodone-acetaminophen (NORCO/VICODIN) 5-325 MG per tablet 1 tablet (has no administration in time range)     IMPRESSION / MDM / ASSESSMENT AND PLAN / ED COURSE  I reviewed the triage vital signs and the nursing  notes.                                 Differential diagnosis includes, but is not limited to, UTI, pyelonephritis, nephrolithiasis, pregnancy, ectopic pregnancy, ovarian cyst, colitis, appendicitis   Patient's presentation is most consistent with acute presentation with potential threat to life or bodily function.   Patient's diagnosis is consistent with ovarian cyst.  Patient presents with lower abdominal pain radiating into the back.. Patient had slight hematuria in her urine but no signs of infection.  Labs are otherwise reassuring.  CT scan was obtained given the description of flank and 2 abdominal pain with hematuria to evaluate for stone.  There was no evidence of pyelonephritis or nephrolithiasis.  Questionable enlargement of the right uterus/ovarian  region prompted ultrasound.findings are consistent with an ovarian cyst without evidence of torsion.  Patient be given symptom control medication.  Follow-up with OB/GYN.  Return precautions discussed with the patient.   Patient is given ED precautions to return to the ED for any worsening or new symptoms.     FINAL CLINICAL IMPRESSION(S) / ED DIAGNOSES   Final diagnoses:  Cyst of right ovary     Rx / DC Orders   ED Discharge Orders          Ordered    meloxicam (MOBIC) 15 MG tablet  Daily        07/03/23 2254    HYDROcodone-acetaminophen (NORCO/VICODIN) 5-325 MG tablet  Every 4 hours PRN        07/03/23 2254             Note:  This document was prepared using Dragon voice recognition software and may include unintentional dictation errors.   Lanette Hampshire 07/03/23 2258    Chesley Noon, MD 07/04/23 2337

## 2023-07-04 ENCOUNTER — Telehealth: Payer: Self-pay

## 2023-07-04 NOTE — Telephone Encounter (Signed)
Pt called triage line asking to make a new patient appointment. LVM for patient to return call.

## 2024-02-17 ENCOUNTER — Encounter: Payer: Self-pay | Admitting: Nurse Practitioner

## 2024-02-17 ENCOUNTER — Ambulatory Visit: Payer: Self-pay | Admitting: Nurse Practitioner

## 2024-02-17 VITALS — BP 119/63 | HR 73 | Ht 63.0 in | Wt 227.8 lb

## 2024-02-17 DIAGNOSIS — Z3009 Encounter for other general counseling and advice on contraception: Secondary | ICD-10-CM

## 2024-02-17 DIAGNOSIS — Z113 Encounter for screening for infections with a predominantly sexual mode of transmission: Secondary | ICD-10-CM

## 2024-02-17 DIAGNOSIS — Z01419 Encounter for gynecological examination (general) (routine) without abnormal findings: Secondary | ICD-10-CM

## 2024-02-17 LAB — WET PREP FOR TRICH, YEAST, CLUE
Trichomonas Exam: NEGATIVE
Yeast Exam: NEGATIVE

## 2024-02-17 LAB — HM HIV SCREENING LAB: HM HIV Screening: NEGATIVE

## 2024-02-17 NOTE — Progress Notes (Signed)
 [employer] HEALTH DEPARTMENT [facility] [address] [place] [address] Main phone: [phone]  Family Planning Visit - Repeat Yearly Visit  Subjective:  Selena Chavez is a 23 y.o. G1P1001  being seen today for an annual wellness visit and to discuss contraception options. The patient is currently using hormonal implant for pregnancy prevention. Patient does not want a pregnancy in the next year.   Patient reports they are looking for a method with the following characteristics:  High efficacy at preventing pregnancy Discrete method Long term method Method that does not involve too much memory  Patient has the following medical problems:  There are no active problems to display for this patient.   Chief Complaint  Patient presents with   Annual Exam    Physical    HPI Patient is a pleasant 23 y.o. female who presents to the office today for annual well woman exam, PAP, and is requesting asymptomatic STI testing.  Patient reports concerns today include the following: Weight gain about 25 pounds that she has noticed for longer than 1 year. She believes it may be related to the Nexplanon insertion. She reports 3-4 (16.9oz) bottles of water daily and wals every morning for about 50 minutes.  Patient indicates 1 female partner in the last 2 and 12 months. She reports practicing vaginal and oral sex and does not use condoms since having the Nexplanon inserted. Patient indicates no STI history. Patient reports last sex was 02/15/24 without a condom. She indicates use of Nexplanon as contraception method.  Patient indicates LMP was 02/11/2024 and has periods monthly.  The patient does report breastfeeding at night.    Review of Systems  Constitutional:  Negative for weight loss.       Positive for weight gain  HENT:  Negative for sore throat.   Eyes:  Negative for blurred vision.  Respiratory:  Negative for cough, shortness of breath  and wheezing.   Cardiovascular:  Negative for chest pain and claudication.  Gastrointestinal:  Negative for nausea and vomiting.  Genitourinary:  Negative for dysuria and frequency.  Skin:  Negative for rash.  Neurological:  Negative for dizziness, seizures and headaches.  Endo/Heme/Allergies:  Does not bruise/bleed easily.    See flowsheet for other program required questions.   Diabetes screening This patient is 23 y.o. with a BMI of Body mass index is 40.35 kg/m.[name] Kitchen  Is patient eligible for diabetes screening (age >35 and BMI >25)?  no  Was Hgb A1c ordered? not applicable  STI screening Patient reports 1 of partners in last year.  Does this patient desire STI screening?  Yes  Hepatitis C screening Has patient been screened once for HCV in the past?  No  No results found for: "HCVAB"  Does the patient meet criteria for HCV testing? No  (If yes-- Screen for HCV through [facility]) Criteria:  Since the last HCV result, does the patient have any of the following? - Current drug use - Have a partner with drug use - Has been incarcerated  Hepatitis B screening Does the patient meet criteria for HBV testing? No Criteria:  -Household, sexual or needle sharing contact with HBV -History of drug use -HIV positive -Those with known Hep C  Cervical Cancer Screening  No Cervical Cancer Screening results to display.  Health Maintenance Due  Topic Date Due   Meningococcal B Vaccine (1 of 2 - Standard) Never done   HPV VACCINES (2 - 3-dose series)  01/31/2020   CHLAMYDIA SCREENING  07/11/2022   COVID-19 Vaccine (4 - 2024-25 season) 07/06/2023   Cervical Cancer Screening (Pap smear)  Never done    The following portions of the patient's history were reviewed and updated as appropriate: allergies, current medications, past family history, past medical history, past social history, past surgical history and problem list. Problem list updated.  Objective:   Vitals:   02/17/24  0858  BP: 119/63  Pulse: 73  Weight: 227 lb 12.8 oz (103.3 kg)  Height: 5\' 3"  (1.6 m)    Physical Exam Vitals and nursing note reviewed. Exam conducted with a chaperone present Nani Baba, [place] interpreter, present as chaperone.).  Constitutional:      Appearance: Normal appearance.  HENT:     Head: Normocephalic.     Salivary Glands: Right salivary gland is not diffusely enlarged or tender. Left salivary gland is not diffusely enlarged or tender.     Mouth/Throat:     Lips: Pink. No lesions.     Mouth: Mucous membranes are moist.     Tongue: No lesions. Tongue does not deviate from midline.     Pharynx: Oropharynx is clear. Uvula midline. No oropharyngeal exudate or posterior oropharyngeal erythema.     Tonsils: No tonsillar exudate.  Eyes:     General:        Right eye: No discharge.        Left eye: No discharge.  Neck:     Thyroid: No thyroid mass or thyroid tenderness.     Trachea: Trachea and phonation normal. No tracheal tenderness or tracheal deviation.  Cardiovascular:     Rate and Rhythm: Normal rate and regular rhythm.     Heart sounds: Normal heart sounds, S1 normal and S2 normal.  Pulmonary:     Effort: Pulmonary effort is normal.     Breath sounds: Normal breath sounds and air entry.  Abdominal:     General: Abdomen is flat. Bowel sounds are normal.     Palpations: Abdomen is soft.     Tenderness: There is no abdominal tenderness. There is no guarding or rebound.  Genitourinary:    General: Normal vulva.     Exam position: Lithotomy position.     Pubic Area: No rash or pubic lice.      Tanner stage (genital): 5.     Labia:        Right: No rash, tenderness, lesion or injury.        Left: No rash, tenderness, lesion or injury.      Vagina: Normal. No signs of injury and foreign body. No vaginal discharge, erythema, tenderness, bleeding or lesions.     Cervix: Normal. No cervical motion tenderness, discharge, friability, lesion, erythema, cervical  bleeding or eversion.     Uterus: Normal.      Adnexa: Right adnexa normal and left adnexa normal.     Comments: pH<4.5 Lymphadenopathy:     Head:     Right side of head: No submental, submandibular, tonsillar, preauricular or posterior auricular adenopathy.     Left side of head: No submental, submandibular, tonsillar, preauricular or posterior auricular adenopathy.     Cervical: No cervical adenopathy.     Right cervical: No superficial or posterior cervical adenopathy.    Left cervical: No superficial or posterior cervical adenopathy.     Upper Body:     Right upper body: No supraclavicular or axillary adenopathy.     Left upper body: No supraclavicular or axillary adenopathy.  Lower Body: No right inguinal adenopathy. No left inguinal adenopathy.  Skin:    General: Skin is warm and dry.     Findings: No lesion or rash.     Comments: Skin tone appropriate for ethnicity.   Neurological:     Mental Status: She is alert and oriented to person, place, and time.  Psychiatric:        Attention and Perception: Attention and perception normal.        Mood and Affect: Mood and affect normal.        Speech: Speech normal.        Behavior: Behavior normal. Behavior is cooperative.        Thought Content: Thought content normal.     Assessment and Plan:  Selena Chavez is a 23 y.o. female G1P1001 presenting to the [facility] Department for an yearly wellness and contraception visit  1. Family planning (Primary) Contraception counseling: Reviewed options based on patient desire and reproductive life plan. Patient is interested in Hormonal Implant. This was not provided to the patient today, because patient has a current Nexplanon implant in place that is not due for removal until 09/2025).  Risks, benefits, and typical effectiveness rates were reviewed.  Questions were answered.  Written information was also given to the patient to review.    The patient will follow up  in  1 years for surveillance.  The patient was told to call with any further questions, or with any concerns about this method of contraception.  Emphasized use of condoms 100% of the time for STI prevention.  Educated on ECP and assessed need for ECP. Patient does not qualify for ECP based on no unprotected sex.   2. Well woman exam PAP performed today. Repeat PAP will depend on results of today's screening. Patient will be contacted by letter via mail with instructions once results are in.  CBE not performed per ACOG age guidelines. Patient has no breast concerns today.  Patient does not have a PCP. PCP list given and patient encouraged to establish care. Patient verbalized understanding and agreed with plan.   - IGP, rfx Aptima HPV ASCU  3. Screening for venereal disease Asymptomatic testing completed today. Patient will be contacted by phone only if results are positive.  - Chlamydia/Gonorrhea [facility] - Gonococcus culture - HIV [facility] - WET PREP FOR TRICH, YEAST, CLUE - Syphilis Serology, [facility]  Return in about 1 year (around 02/16/2025) for annual well-woman exam.  No future appointments.  Due to language barrier, a Spanish interpreter Edgar Goods T.) was present in person during the history-taking, subsequent discussion, and physical exam with this patient.   Merleen Stare, NP

## 2024-02-23 LAB — GONOCOCCUS CULTURE

## 2024-02-24 LAB — IGP, RFX APTIMA HPV ASCU: PAP Smear Comment: 0

## 2024-02-25 ENCOUNTER — Encounter: Payer: Self-pay | Admitting: Nurse Practitioner

## 2024-02-25 NOTE — Progress Notes (Signed)
 NILM, HPV testing criteria not met. No PAP history. Please send letter letting patient know she needs a standard follow-up PAP in 3 years per ASCCP guidelines.  Thank you,  Leary Provencal L. Bentley Fissel, FNP-C
